# Patient Record
Sex: Female | Born: 1985 | Race: Black or African American | Hispanic: No | Marital: Single | Smoking: Former smoker
Health system: Southern US, Community
[De-identification: ages and names within clinical notes are randomized; demographics above are authoritative.]

## PROBLEM LIST (undated history)

## (undated) ENCOUNTER — Inpatient Hospital Stay (HOSPITAL_COMMUNITY): Payer: Self-pay

## (undated) DIAGNOSIS — O139 Gestational [pregnancy-induced] hypertension without significant proteinuria, unspecified trimester: Secondary | ICD-10-CM

## (undated) DIAGNOSIS — F32A Depression, unspecified: Secondary | ICD-10-CM

## (undated) DIAGNOSIS — D649 Anemia, unspecified: Secondary | ICD-10-CM

## (undated) DIAGNOSIS — E876 Hypokalemia: Secondary | ICD-10-CM

## (undated) DIAGNOSIS — F329 Major depressive disorder, single episode, unspecified: Secondary | ICD-10-CM

## (undated) DIAGNOSIS — N390 Urinary tract infection, site not specified: Secondary | ICD-10-CM

## (undated) DIAGNOSIS — R51 Headache: Secondary | ICD-10-CM

## (undated) DIAGNOSIS — O24419 Gestational diabetes mellitus in pregnancy, unspecified control: Secondary | ICD-10-CM

## (undated) HISTORY — PX: CHOLECYSTECTOMY: SHX55

## (undated) HISTORY — PX: TONSILLECTOMY: SUR1361

---

## 2008-01-01 ENCOUNTER — Emergency Department (HOSPITAL_COMMUNITY): Admission: EM | Admit: 2008-01-01 | Discharge: 2008-01-02 | Payer: Self-pay | Admitting: Emergency Medicine

## 2008-02-12 ENCOUNTER — Inpatient Hospital Stay (HOSPITAL_COMMUNITY): Admission: AD | Admit: 2008-02-12 | Discharge: 2008-02-12 | Payer: Self-pay | Admitting: Obstetrics & Gynecology

## 2008-04-26 ENCOUNTER — Inpatient Hospital Stay (HOSPITAL_COMMUNITY): Admission: AD | Admit: 2008-04-26 | Discharge: 2008-04-26 | Payer: Self-pay | Admitting: Obstetrics & Gynecology

## 2008-07-18 ENCOUNTER — Emergency Department (HOSPITAL_COMMUNITY): Admission: EM | Admit: 2008-07-18 | Discharge: 2008-07-18 | Payer: Self-pay | Admitting: Emergency Medicine

## 2008-07-18 ENCOUNTER — Other Ambulatory Visit: Payer: Self-pay | Admitting: Obstetrics & Gynecology

## 2008-09-22 ENCOUNTER — Emergency Department (HOSPITAL_COMMUNITY): Admission: EM | Admit: 2008-09-22 | Discharge: 2008-09-22 | Payer: Self-pay | Admitting: Emergency Medicine

## 2008-10-17 ENCOUNTER — Emergency Department (HOSPITAL_COMMUNITY): Admission: EM | Admit: 2008-10-17 | Discharge: 2008-10-17 | Payer: Self-pay | Admitting: Emergency Medicine

## 2008-10-19 ENCOUNTER — Emergency Department (HOSPITAL_COMMUNITY): Admission: EM | Admit: 2008-10-19 | Discharge: 2008-10-19 | Payer: Self-pay | Admitting: Emergency Medicine

## 2008-11-05 ENCOUNTER — Inpatient Hospital Stay (HOSPITAL_COMMUNITY): Admission: AD | Admit: 2008-11-05 | Discharge: 2008-11-05 | Payer: Self-pay | Admitting: Obstetrics & Gynecology

## 2009-01-16 ENCOUNTER — Observation Stay (HOSPITAL_COMMUNITY): Admission: EM | Admit: 2009-01-16 | Discharge: 2009-01-17 | Payer: Self-pay | Admitting: Emergency Medicine

## 2009-01-18 ENCOUNTER — Emergency Department (HOSPITAL_COMMUNITY): Admission: EM | Admit: 2009-01-18 | Discharge: 2009-01-18 | Payer: Self-pay | Admitting: Emergency Medicine

## 2009-02-11 ENCOUNTER — Emergency Department (HOSPITAL_COMMUNITY): Admission: EM | Admit: 2009-02-11 | Discharge: 2009-02-11 | Payer: Self-pay | Admitting: Emergency Medicine

## 2009-03-01 ENCOUNTER — Emergency Department (HOSPITAL_COMMUNITY): Admission: EM | Admit: 2009-03-01 | Discharge: 2009-03-01 | Payer: Self-pay | Admitting: Emergency Medicine

## 2009-03-02 ENCOUNTER — Inpatient Hospital Stay (HOSPITAL_COMMUNITY): Admission: AD | Admit: 2009-03-02 | Discharge: 2009-03-02 | Payer: Self-pay | Admitting: Obstetrics & Gynecology

## 2009-03-15 ENCOUNTER — Inpatient Hospital Stay (HOSPITAL_COMMUNITY): Admission: AD | Admit: 2009-03-15 | Discharge: 2009-03-15 | Payer: Self-pay | Admitting: Obstetrics and Gynecology

## 2009-04-05 ENCOUNTER — Emergency Department (HOSPITAL_COMMUNITY): Admission: EM | Admit: 2009-04-05 | Discharge: 2009-04-06 | Payer: Self-pay | Admitting: Emergency Medicine

## 2009-04-06 ENCOUNTER — Emergency Department (HOSPITAL_COMMUNITY): Admission: EM | Admit: 2009-04-06 | Discharge: 2009-04-06 | Payer: Self-pay | Admitting: Family Medicine

## 2009-04-06 ENCOUNTER — Emergency Department (HOSPITAL_COMMUNITY): Admission: EM | Admit: 2009-04-06 | Discharge: 2009-04-06 | Payer: Self-pay | Admitting: Emergency Medicine

## 2009-04-07 ENCOUNTER — Emergency Department (HOSPITAL_COMMUNITY): Admission: EM | Admit: 2009-04-07 | Discharge: 2009-04-07 | Payer: Self-pay | Admitting: Emergency Medicine

## 2009-05-08 ENCOUNTER — Encounter: Payer: Self-pay | Admitting: Family Medicine

## 2009-05-08 ENCOUNTER — Inpatient Hospital Stay (HOSPITAL_COMMUNITY): Admission: RE | Admit: 2009-05-08 | Discharge: 2009-05-08 | Payer: Self-pay | Admitting: Family Medicine

## 2009-05-19 ENCOUNTER — Ambulatory Visit (HOSPITAL_COMMUNITY): Admission: RE | Admit: 2009-05-19 | Discharge: 2009-05-19 | Payer: Self-pay | Admitting: Family Medicine

## 2009-05-29 ENCOUNTER — Ambulatory Visit: Payer: Self-pay | Admitting: Physician Assistant

## 2009-05-29 ENCOUNTER — Inpatient Hospital Stay (HOSPITAL_COMMUNITY): Admission: RE | Admit: 2009-05-29 | Discharge: 2009-05-29 | Payer: Self-pay | Admitting: Obstetrics & Gynecology

## 2009-06-15 ENCOUNTER — Other Ambulatory Visit: Payer: Self-pay | Admitting: Emergency Medicine

## 2009-06-16 ENCOUNTER — Encounter: Payer: Self-pay | Admitting: Family Medicine

## 2009-06-30 ENCOUNTER — Ambulatory Visit: Payer: Self-pay | Admitting: Advanced Practice Midwife

## 2009-06-30 ENCOUNTER — Inpatient Hospital Stay (HOSPITAL_COMMUNITY): Admission: AD | Admit: 2009-06-30 | Discharge: 2009-06-30 | Payer: Self-pay | Admitting: Obstetrics & Gynecology

## 2009-07-06 ENCOUNTER — Inpatient Hospital Stay (HOSPITAL_COMMUNITY): Admission: AD | Admit: 2009-07-06 | Discharge: 2009-07-06 | Payer: Self-pay | Admitting: Obstetrics and Gynecology

## 2009-07-23 ENCOUNTER — Ambulatory Visit: Payer: Self-pay | Admitting: Advanced Practice Midwife

## 2009-07-27 ENCOUNTER — Ambulatory Visit: Payer: Self-pay | Admitting: Advanced Practice Midwife

## 2009-07-27 ENCOUNTER — Inpatient Hospital Stay (HOSPITAL_COMMUNITY): Admission: AD | Admit: 2009-07-27 | Discharge: 2009-07-27 | Payer: Self-pay | Admitting: Obstetrics & Gynecology

## 2009-08-18 ENCOUNTER — Inpatient Hospital Stay (HOSPITAL_COMMUNITY): Admission: AD | Admit: 2009-08-18 | Discharge: 2009-08-18 | Payer: Self-pay | Admitting: Obstetrics & Gynecology

## 2009-09-04 ENCOUNTER — Observation Stay (HOSPITAL_COMMUNITY): Admission: AD | Admit: 2009-09-04 | Discharge: 2009-09-04 | Payer: Self-pay | Admitting: Obstetrics & Gynecology

## 2009-09-13 ENCOUNTER — Ambulatory Visit: Payer: Self-pay | Admitting: Obstetrics and Gynecology

## 2009-09-13 ENCOUNTER — Inpatient Hospital Stay (HOSPITAL_COMMUNITY): Admission: AD | Admit: 2009-09-13 | Discharge: 2009-09-13 | Payer: Self-pay | Admitting: Obstetrics & Gynecology

## 2009-09-20 ENCOUNTER — Inpatient Hospital Stay (HOSPITAL_COMMUNITY): Admission: RE | Admit: 2009-09-20 | Discharge: 2009-09-23 | Payer: Self-pay | Admitting: Obstetrics & Gynecology

## 2009-09-20 ENCOUNTER — Ambulatory Visit: Payer: Self-pay | Admitting: Obstetrics & Gynecology

## 2009-10-04 ENCOUNTER — Ambulatory Visit: Payer: Self-pay | Admitting: Family Medicine

## 2009-10-04 ENCOUNTER — Inpatient Hospital Stay (HOSPITAL_COMMUNITY): Admission: AD | Admit: 2009-10-04 | Discharge: 2009-10-04 | Payer: Self-pay | Admitting: Obstetrics & Gynecology

## 2010-02-10 ENCOUNTER — Emergency Department (HOSPITAL_COMMUNITY): Admission: EM | Admit: 2010-02-10 | Discharge: 2010-02-11 | Payer: Self-pay | Admitting: Emergency Medicine

## 2010-02-21 ENCOUNTER — Inpatient Hospital Stay (HOSPITAL_COMMUNITY)
Admission: AD | Admit: 2010-02-21 | Discharge: 2010-02-21 | Payer: Self-pay | Source: Home / Self Care | Admitting: Obstetrics and Gynecology

## 2010-02-22 ENCOUNTER — Inpatient Hospital Stay (HOSPITAL_COMMUNITY): Admission: AD | Admit: 2010-02-22 | Discharge: 2009-07-23 | Payer: Self-pay | Admitting: Obstetrics & Gynecology

## 2010-02-22 ENCOUNTER — Inpatient Hospital Stay (HOSPITAL_COMMUNITY): Admission: AD | Admit: 2010-02-22 | Discharge: 2009-06-16 | Payer: Self-pay | Admitting: Family Medicine

## 2010-02-22 ENCOUNTER — Emergency Department (HOSPITAL_COMMUNITY): Admission: EM | Admit: 2010-02-22 | Discharge: 2009-11-28 | Payer: Self-pay | Admitting: Emergency Medicine

## 2010-02-27 ENCOUNTER — Emergency Department (HOSPITAL_COMMUNITY)
Admission: EM | Admit: 2010-02-27 | Discharge: 2010-02-27 | Payer: Self-pay | Source: Home / Self Care | Admitting: Emergency Medicine

## 2010-04-04 ENCOUNTER — Inpatient Hospital Stay (HOSPITAL_COMMUNITY)
Admission: AD | Admit: 2010-04-04 | Discharge: 2010-04-04 | Payer: Self-pay | Source: Home / Self Care | Attending: Obstetrics and Gynecology | Admitting: Obstetrics and Gynecology

## 2010-04-09 LAB — HCG, QUANTITATIVE, PREGNANCY: hCG, Beta Chain, Quant, S: 62370 m[IU]/mL — ABNORMAL HIGH (ref <5)

## 2010-04-09 LAB — CBC
MCH: 24.7 pg — ABNORMAL LOW (ref 26.0–34.0)
MCV: 75.6 fL — ABNORMAL LOW (ref 78.0–100.0)
RDW: 16.6 % — ABNORMAL HIGH (ref 11.5–15.5)

## 2010-04-09 LAB — URINALYSIS, ROUTINE W REFLEX MICROSCOPIC
Bilirubin Urine: NEGATIVE
Hgb urine dipstick: NEGATIVE
Ketones, ur: NEGATIVE mg/dL
Nitrite: NEGATIVE
Protein, ur: NEGATIVE mg/dL
Urine Glucose, Fasting: NEGATIVE mg/dL
pH: 7 (ref 5.0–8.0)

## 2010-04-09 LAB — GC/CHLAMYDIA PROBE AMP, GENITAL
Chlamydia, DNA Probe: NEGATIVE
GC Probe Amp, Genital: NEGATIVE

## 2010-04-09 LAB — WET PREP, GENITAL
Trich, Wet Prep: NONE SEEN
Yeast Wet Prep HPF POC: NONE SEEN

## 2010-05-16 ENCOUNTER — Emergency Department (HOSPITAL_COMMUNITY)
Admission: EM | Admit: 2010-05-16 | Discharge: 2010-05-16 | Disposition: A | Discharge status: Home / Self Care | Payer: Medicaid Other | Attending: Emergency Medicine | Admitting: Emergency Medicine

## 2010-05-16 ENCOUNTER — Emergency Department (HOSPITAL_COMMUNITY): Payer: Medicaid Other

## 2010-05-16 DIAGNOSIS — O2390 Unspecified genitourinary tract infection in pregnancy, unspecified trimester: Secondary | ICD-10-CM | POA: Insufficient documentation

## 2010-05-16 DIAGNOSIS — K219 Gastro-esophageal reflux disease without esophagitis: Secondary | ICD-10-CM | POA: Insufficient documentation

## 2010-05-16 DIAGNOSIS — R079 Chest pain, unspecified: Secondary | ICD-10-CM | POA: Insufficient documentation

## 2010-05-16 DIAGNOSIS — R071 Chest pain on breathing: Secondary | ICD-10-CM | POA: Insufficient documentation

## 2010-05-16 DIAGNOSIS — F3289 Other specified depressive episodes: Secondary | ICD-10-CM | POA: Insufficient documentation

## 2010-05-16 DIAGNOSIS — N390 Urinary tract infection, site not specified: Secondary | ICD-10-CM | POA: Insufficient documentation

## 2010-05-16 DIAGNOSIS — F329 Major depressive disorder, single episode, unspecified: Secondary | ICD-10-CM | POA: Insufficient documentation

## 2010-05-16 DIAGNOSIS — I10 Essential (primary) hypertension: Secondary | ICD-10-CM | POA: Insufficient documentation

## 2010-05-16 LAB — URINALYSIS, ROUTINE W REFLEX MICROSCOPIC
Bilirubin Urine: NEGATIVE
Hgb urine dipstick: NEGATIVE
Ketones, ur: NEGATIVE mg/dL
Nitrite: NEGATIVE
Protein, ur: NEGATIVE mg/dL
Urobilinogen, UA: 1 mg/dL (ref 0.0–1.0)

## 2010-05-16 LAB — POCT PREGNANCY, URINE: Preg Test, Ur: POSITIVE

## 2010-05-16 LAB — URINE MICROSCOPIC-ADD ON

## 2010-05-29 LAB — COMPREHENSIVE METABOLIC PANEL
Albumin: 3.7 g/dL (ref 3.5–5.2)
Alkaline Phosphatase: 114 U/L (ref 39–117)
BUN: 3 mg/dL — ABNORMAL LOW (ref 6–23)
CO2: 25 mEq/L (ref 19–32)
Chloride: 107 mEq/L (ref 96–112)
Creatinine, Ser: 0.76 mg/dL (ref 0.4–1.2)
GFR calc non Af Amer: >60 mL/min (ref >60)
Glucose, Bld: 97 mg/dL (ref 70–99)
Potassium: 3.5 mEq/L (ref 3.5–5.1)
Total Bilirubin: 0.4 mg/dL (ref 0.3–1.2)

## 2010-05-29 LAB — DIFFERENTIAL
Basophils Absolute: 0 10*3/uL (ref 0.0–0.1)
Basophils Relative: 0 % (ref 0–1)
Lymphocytes Relative: 42 % (ref 12–46)
Monocytes Absolute: 0.5 10*3/uL (ref 0.1–1.0)
Neutro Abs: 4.3 10*3/uL (ref 1.7–7.7)
Neutrophils Relative %: 47 % (ref 43–77)

## 2010-05-29 LAB — URINE MICROSCOPIC-ADD ON

## 2010-05-29 LAB — URINALYSIS, ROUTINE W REFLEX MICROSCOPIC
Bilirubin Urine: NEGATIVE
Bilirubin Urine: NEGATIVE
Glucose, UA: NEGATIVE mg/dL
Ketones, ur: NEGATIVE mg/dL
Ketones, ur: NEGATIVE mg/dL
Nitrite: NEGATIVE
Nitrite: NEGATIVE
Nitrite: NEGATIVE
Specific Gravity, Urine: 1.012 (ref 1.005–1.030)
Specific Gravity, Urine: 1.025 (ref 1.005–1.030)
Urobilinogen, UA: 0.2 mg/dL (ref 0.0–1.0)
Urobilinogen, UA: 1 mg/dL (ref 0.0–1.0)
pH: 6 (ref 5.0–8.0)
pH: 6.5 (ref 5.0–8.0)

## 2010-05-29 LAB — CBC
HCT: 34.3 % — ABNORMAL LOW (ref 36.0–46.0)
Hemoglobin: 10.9 g/dL — ABNORMAL LOW (ref 12.0–15.0)
MCH: 23.3 pg — ABNORMAL LOW (ref 26.0–34.0)
MCV: 73.4 fL — ABNORMAL LOW (ref 78.0–100.0)
RBC: 4.67 MIL/uL (ref 3.87–5.11)
WBC: 9.2 10*3/uL (ref 4.0–10.5)

## 2010-05-29 LAB — URINE CULTURE
Culture  Setup Time: 201111271259
Culture  Setup Time: 201112131737

## 2010-06-03 LAB — BASIC METABOLIC PANEL
BUN: 2 mg/dL — ABNORMAL LOW (ref 6–23)
Chloride: 105 mEq/L (ref 96–112)
Creatinine, Ser: 0.56 mg/dL (ref 0.4–1.2)

## 2010-06-03 LAB — CROSSMATCH: ABO/RH(D): O POS

## 2010-06-03 LAB — URINALYSIS, ROUTINE W REFLEX MICROSCOPIC
Ketones, ur: NEGATIVE mg/dL
Nitrite: NEGATIVE
Specific Gravity, Urine: 1.016 (ref 1.005–1.030)
pH: 7 (ref 5.0–8.0)

## 2010-06-03 LAB — CBC
HCT: 26.4 % — ABNORMAL LOW (ref 36.0–46.0)
HCT: 31.5 % — ABNORMAL LOW (ref 36.0–46.0)
Hemoglobin: 8.8 g/dL — ABNORMAL LOW (ref 12.0–15.0)
Hemoglobin: 10.5 g/dL — ABNORMAL LOW (ref 12.0–15.0)
MCH: 28.1 pg (ref 26.0–34.0)
MCH: 28.8 pg (ref 26.0–34.0)
MCH: 28.5 pg (ref 26.0–34.0)
MCHC: 33.4 g/dL (ref 30.0–36.0)
MCV: 84.2 fL (ref 78.0–100.0)
MCV: 84.3 fL (ref 78.0–100.0)
MCV: 89.1 fL (ref 78.0–100.0)
MCV: 85.3 fL (ref 78.0–100.0)
Platelets: 180 10*3/uL (ref 150–400)
Platelets: 211 10*3/uL (ref 150–400)
Platelets: 212 10*3/uL (ref 150–400)
RBC: 2.75 MIL/uL — ABNORMAL LOW (ref 3.87–5.11)
RBC: 3.69 MIL/uL — ABNORMAL LOW (ref 3.87–5.11)
RDW: 14.4 % (ref 11.5–15.5)
WBC: 8.7 10*3/uL (ref 4.0–10.5)

## 2010-06-03 LAB — RAPID URINE DRUG SCREEN, HOSP PERFORMED
Barbiturates: NOT DETECTED
Benzodiazepines: NOT DETECTED

## 2010-06-03 LAB — GLUCOSE, CAPILLARY: Glucose-Capillary: 80 mg/dL (ref 70–99)

## 2010-06-03 LAB — POCT CARDIAC MARKERS
CKMB, poc: <1 ng/mL — ABNORMAL LOW (ref 1.0–8.0)
Troponin i, poc: <0.05 ng/mL (ref 0.00–0.09)

## 2010-06-03 LAB — DIFFERENTIAL
Lymphocytes Relative: 28 % (ref 12–46)
Lymphs Abs: 2.4 10*3/uL (ref 0.7–4.0)
Neutrophils Relative %: 64 % (ref 43–77)

## 2010-06-03 LAB — HCG, QUANTITATIVE, PREGNANCY: hCG, Beta Chain, Quant, S: 36245 m[IU]/mL — ABNORMAL HIGH (ref <5)

## 2010-06-03 LAB — URINE MICROSCOPIC-ADD ON

## 2010-06-03 LAB — DIC (DISSEMINATED INTRAVASCULAR COAGULATION)PANEL
INR: 1.08 (ref 0.00–1.49)
Prothrombin Time: 13.9 seconds (ref 11.6–15.2)

## 2010-06-03 LAB — POCT I-STAT, CHEM 8
Calcium, Ion: 1.2 mmol/L (ref 1.12–1.32)
Chloride: 106 mEq/L (ref 96–112)
HCT: 32 % — ABNORMAL LOW (ref 36.0–46.0)
Hemoglobin: 10.9 g/dL — ABNORMAL LOW (ref 12.0–15.0)

## 2010-06-03 LAB — RAPID STREP SCREEN (MED CTR MEBANE ONLY): Streptococcus, Group A Screen (Direct): NEGATIVE

## 2010-06-04 LAB — URINALYSIS, ROUTINE W REFLEX MICROSCOPIC
Glucose, UA: NEGATIVE mg/dL
Nitrite: NEGATIVE
Specific Gravity, Urine: 1.025 (ref 1.005–1.030)
pH: 6.5 (ref 5.0–8.0)

## 2010-06-04 LAB — GC/CHLAMYDIA PROBE AMP, GENITAL
Chlamydia, DNA Probe: NEGATIVE
GC Probe Amp, Genital: NEGATIVE

## 2010-06-04 LAB — URINE CULTURE: Colony Count: 1000

## 2010-06-04 LAB — URINE MICROSCOPIC-ADD ON

## 2010-06-05 LAB — URINALYSIS, ROUTINE W REFLEX MICROSCOPIC
Bilirubin Urine: NEGATIVE
Glucose, UA: NEGATIVE mg/dL
Glucose, UA: NEGATIVE mg/dL
Glucose, UA: 100 mg/dL — AB
Hgb urine dipstick: NEGATIVE
Hgb urine dipstick: NEGATIVE
Ketones, ur: NEGATIVE mg/dL
Ketones, ur: NEGATIVE mg/dL
Ketones, ur: NEGATIVE mg/dL
Nitrite: NEGATIVE
Nitrite: NEGATIVE
Nitrite: NEGATIVE
Protein, ur: NEGATIVE mg/dL
Protein, ur: NEGATIVE mg/dL
Protein, ur: 30 mg/dL — AB
Protein, ur: NEGATIVE mg/dL
Specific Gravity, Urine: >1.03 — ABNORMAL HIGH (ref 1.005–1.030)
Urobilinogen, UA: 1 mg/dL (ref 0.0–1.0)
Urobilinogen, UA: >8 mg/dL — ABNORMAL HIGH (ref 0.0–1.0)
pH: 5.5 (ref 5.0–8.0)
pH: 7.5 (ref 5.0–8.0)
pH: 6 (ref 5.0–8.0)

## 2010-06-05 LAB — DIFFERENTIAL
Basophils Absolute: 0 K/uL (ref 0.0–0.1)
Basophils Relative: 0 % (ref 0–1)
Eosinophils Absolute: 0.2 K/uL (ref 0.0–0.7)
Eosinophils Relative: 2 % (ref 0–5)
Lymphocytes Relative: 14 % (ref 12–46)
Lymphs Abs: 1 K/uL (ref 0.7–4.0)
Monocytes Absolute: 0.4 K/uL (ref 0.1–1.0)
Monocytes Relative: 6 % (ref 3–12)
Neutro Abs: 5.8 K/uL (ref 1.7–7.7)
Neutrophils Relative %: 78 % — ABNORMAL HIGH (ref 43–77)

## 2010-06-05 LAB — RAPID URINE DRUG SCREEN, HOSP PERFORMED
Amphetamines: NOT DETECTED
Barbiturates: POSITIVE — AB
Benzodiazepines: NOT DETECTED
Cocaine: NOT DETECTED
Opiates: NOT DETECTED
Tetrahydrocannabinol: POSITIVE — AB

## 2010-06-05 LAB — URINE CULTURE
Colony Count: 10000
Colony Count: 15000

## 2010-06-05 LAB — URINE MICROSCOPIC-ADD ON

## 2010-06-05 LAB — WET PREP, GENITAL
Trich, Wet Prep: NONE SEEN
Trich, Wet Prep: NONE SEEN
Yeast Wet Prep HPF POC: NONE SEEN

## 2010-06-05 LAB — COMPREHENSIVE METABOLIC PANEL
ALT: 13 U/L (ref 0–35)
AST: 17 U/L (ref 0–37)
Albumin: 2.7 g/dL — ABNORMAL LOW (ref 3.5–5.2)
Alkaline Phosphatase: 118 U/L — ABNORMAL HIGH (ref 39–117)
BUN: 2 mg/dL — ABNORMAL LOW (ref 6–23)
Chloride: 104 mEq/L (ref 96–112)
Potassium: 3.4 mEq/L — ABNORMAL LOW (ref 3.5–5.1)
Sodium: 136 mEq/L (ref 135–145)
Total Bilirubin: 0.5 mg/dL (ref 0.3–1.2)
Total Protein: 6.5 g/dL (ref 6.0–8.3)

## 2010-06-05 LAB — CBC
HCT: 32.9 % — ABNORMAL LOW (ref 36.0–46.0)
HCT: 32.7 % — ABNORMAL LOW (ref 36.0–46.0)
Hemoglobin: 11.1 g/dL — ABNORMAL LOW (ref 12.0–15.0)
Platelets: 216 10*3/uL (ref 150–400)
RDW: 13.8 % (ref 11.5–15.5)
RDW: 13.1 % (ref 11.5–15.5)
WBC: 8.9 10*3/uL (ref 4.0–10.5)

## 2010-06-05 LAB — GC/CHLAMYDIA PROBE AMP, GENITAL: Chlamydia, DNA Probe: NEGATIVE

## 2010-06-05 LAB — GC/CHLAMYDIA PROBE AMP, URINE
Chlamydia, Swab/Urine, PCR: NEGATIVE
GC Probe Amp, Urine: NEGATIVE

## 2010-06-06 LAB — URINALYSIS, ROUTINE W REFLEX MICROSCOPIC
Bilirubin Urine: NEGATIVE
Ketones, ur: NEGATIVE mg/dL
Nitrite: NEGATIVE
Urobilinogen, UA: 0.2 mg/dL (ref 0.0–1.0)

## 2010-06-06 LAB — KLEIHAUER-BETKE STAIN
Fetal Cells %: 0 %
Quantitation Fetal Hemoglobin: 0 mL

## 2010-06-06 LAB — URINE CULTURE: Colony Count: 8000

## 2010-06-11 LAB — URINALYSIS, ROUTINE W REFLEX MICROSCOPIC
Ketones, ur: NEGATIVE mg/dL
Nitrite: NEGATIVE
Specific Gravity, Urine: 1.02 (ref 1.005–1.030)
Urobilinogen, UA: 0.2 mg/dL (ref 0.0–1.0)
pH: 7 (ref 5.0–8.0)

## 2010-06-11 LAB — CBC
MCHC: 34.2 g/dL (ref 30.0–36.0)
Platelets: 218 10*3/uL (ref 150–400)
RBC: 3.59 MIL/uL — ABNORMAL LOW (ref 3.87–5.11)
WBC: 10.8 10*3/uL — ABNORMAL HIGH (ref 4.0–10.5)

## 2010-06-11 LAB — ABO/RH: ABO/RH(D): O POS

## 2010-06-11 LAB — WET PREP, GENITAL

## 2010-06-18 LAB — URINALYSIS, ROUTINE W REFLEX MICROSCOPIC
Bilirubin Urine: NEGATIVE
Glucose, UA: NEGATIVE mg/dL
Hgb urine dipstick: NEGATIVE
Ketones, ur: NEGATIVE mg/dL
Protein, ur: NEGATIVE mg/dL
Urobilinogen, UA: 1 mg/dL (ref 0.0–1.0)

## 2010-06-18 LAB — WET PREP, GENITAL
Clue Cells Wet Prep HPF POC: NONE SEEN
Yeast Wet Prep HPF POC: NONE SEEN

## 2010-06-18 LAB — URINE CULTURE
Colony Count: NO GROWTH
Culture: NO GROWTH

## 2010-06-18 LAB — ABO/RH: ABO/RH(D): O POS

## 2010-06-18 LAB — URINE MICROSCOPIC-ADD ON

## 2010-06-19 LAB — URINALYSIS, ROUTINE W REFLEX MICROSCOPIC
Protein, ur: NEGATIVE mg/dL
Urobilinogen, UA: 1 mg/dL (ref 0.0–1.0)

## 2010-06-19 LAB — POCT PREGNANCY, URINE: Preg Test, Ur: POSITIVE

## 2010-06-19 LAB — COMPREHENSIVE METABOLIC PANEL
ALT: 11 U/L (ref 0–35)
Albumin: 3.5 g/dL (ref 3.5–5.2)
Alkaline Phosphatase: 73 U/L (ref 39–117)
BUN: 5 mg/dL — ABNORMAL LOW (ref 6–23)
GFR calc Af Amer: >60 mL/min (ref >60)
Potassium: 3.6 mEq/L (ref 3.5–5.1)
Sodium: 134 mEq/L — ABNORMAL LOW (ref 135–145)
Total Protein: 6.9 g/dL (ref 6.0–8.3)

## 2010-06-19 LAB — CBC
Platelets: 251 10*3/uL (ref 150–400)
RDW: 13.4 % (ref 11.5–15.5)

## 2010-06-19 LAB — DIFFERENTIAL
Basophils Relative: 1 % (ref 0–1)
Monocytes Absolute: 0.8 10*3/uL (ref 0.1–1.0)
Monocytes Relative: 8 % (ref 3–12)
Neutro Abs: 6.3 10*3/uL (ref 1.7–7.7)

## 2010-06-20 LAB — URINE MICROSCOPIC-ADD ON

## 2010-06-20 LAB — URINALYSIS, ROUTINE W REFLEX MICROSCOPIC
Glucose, UA: NEGATIVE mg/dL
Hgb urine dipstick: NEGATIVE
Protein, ur: NEGATIVE mg/dL

## 2010-06-20 LAB — CBC
Hemoglobin: 13.1 g/dL (ref 12.0–15.0)
Hemoglobin: 14.1 g/dL (ref 12.0–15.0)
MCHC: 34.4 g/dL (ref 30.0–36.0)
MCV: 87.2 fL (ref 78.0–100.0)
Platelets: 237 10*3/uL (ref 150–400)
RDW: 13.2 % (ref 11.5–15.5)
RDW: 13.3 % (ref 11.5–15.5)

## 2010-06-20 LAB — BASIC METABOLIC PANEL
CO2: 26 mEq/L (ref 19–32)
Calcium: 9.3 mg/dL (ref 8.4–10.5)
Chloride: 104 mEq/L (ref 96–112)
Creatinine, Ser: 0.84 mg/dL (ref 0.4–1.2)
Glucose, Bld: 88 mg/dL (ref 70–99)

## 2010-06-20 LAB — COMPREHENSIVE METABOLIC PANEL
ALT: 15 U/L (ref 0–35)
AST: 17 U/L (ref 0–37)
Albumin: 3.7 g/dL (ref 3.5–5.2)
Alkaline Phosphatase: 90 U/L (ref 39–117)
GFR calc Af Amer: >60 mL/min (ref >60)
Potassium: 3.4 mEq/L — ABNORMAL LOW (ref 3.5–5.1)
Sodium: 140 mEq/L (ref 135–145)
Total Protein: 7.2 g/dL (ref 6.0–8.3)

## 2010-06-20 LAB — DIFFERENTIAL
Basophils Absolute: 0.1 10*3/uL (ref 0.0–0.1)
Basophils Relative: 1 % (ref 0–1)
Basophils Relative: 1 % (ref 0–1)
Eosinophils Absolute: 0.3 10*3/uL (ref 0.0–0.7)
Eosinophils Absolute: 0.4 10*3/uL (ref 0.0–0.7)
Monocytes Absolute: 0.5 10*3/uL (ref 0.1–1.0)
Monocytes Absolute: 0.6 10*3/uL (ref 0.1–1.0)
Monocytes Relative: 7 % (ref 3–12)
Neutro Abs: 5 10*3/uL (ref 1.7–7.7)

## 2010-06-20 LAB — POCT PREGNANCY, URINE
Preg Test, Ur: NEGATIVE
Preg Test, Ur: NEGATIVE

## 2010-06-20 LAB — POCT CARDIAC MARKERS: Myoglobin, poc: 51 ng/mL (ref 12–200)

## 2010-06-23 LAB — URINALYSIS, ROUTINE W REFLEX MICROSCOPIC
Bilirubin Urine: NEGATIVE
Glucose, UA: NEGATIVE mg/dL
Glucose, UA: NEGATIVE mg/dL
Hgb urine dipstick: NEGATIVE
Ketones, ur: NEGATIVE mg/dL
Nitrite: NEGATIVE
Protein, ur: NEGATIVE mg/dL
Protein, ur: NEGATIVE mg/dL
Specific Gravity, Urine: 1.027 (ref 1.005–1.030)
Urobilinogen, UA: 1 mg/dL (ref 0.0–1.0)
pH: 6 (ref 5.0–8.0)
pH: 6 (ref 5.0–8.0)
pH: 7 (ref 5.0–8.0)

## 2010-06-23 LAB — DIFFERENTIAL
Basophils Absolute: 0 10*3/uL (ref 0.0–0.1)
Basophils Relative: 1 % (ref 0–1)
Eosinophils Relative: 5 % (ref 0–5)
Lymphocytes Relative: 26 % (ref 12–46)
Lymphocytes Relative: 28 % (ref 12–46)
Monocytes Absolute: 0.4 10*3/uL (ref 0.1–1.0)
Monocytes Absolute: 0.6 10*3/uL (ref 0.1–1.0)
Monocytes Relative: 9 % (ref 3–12)
Neutro Abs: 4.1 10*3/uL (ref 1.7–7.7)
Neutro Abs: 4.3 10*3/uL (ref 1.7–7.7)
Neutrophils Relative %: 62 % (ref 43–77)

## 2010-06-23 LAB — CBC
HCT: 38.2 % (ref 36.0–46.0)
HCT: 39.3 % (ref 36.0–46.0)
Hemoglobin: 12.9 g/dL (ref 12.0–15.0)
MCV: 85.8 fL (ref 78.0–100.0)
Platelets: 260 10*3/uL (ref 150–400)
Platelets: 286 10*3/uL (ref 150–400)
RBC: 4.58 MIL/uL (ref 3.87–5.11)
RDW: 14.3 % (ref 11.5–15.5)
WBC: 6.8 10*3/uL (ref 4.0–10.5)
WBC: 6.9 10*3/uL (ref 4.0–10.5)

## 2010-06-23 LAB — POCT PREGNANCY, URINE
Preg Test, Ur: NEGATIVE
Preg Test, Ur: NEGATIVE

## 2010-06-23 LAB — COMPREHENSIVE METABOLIC PANEL
AST: 18 U/L (ref 0–37)
Albumin: 3.8 g/dL (ref 3.5–5.2)
Alkaline Phosphatase: 82 U/L (ref 39–117)
Alkaline Phosphatase: 84 U/L (ref 39–117)
BUN: 6 mg/dL (ref 6–23)
BUN: 6 mg/dL (ref 6–23)
CO2: 29 mEq/L (ref 19–32)
Chloride: 103 mEq/L (ref 96–112)
Chloride: 105 mEq/L (ref 96–112)
Creatinine, Ser: 0.78 mg/dL (ref 0.4–1.2)
Creatinine, Ser: 0.79 mg/dL (ref 0.4–1.2)
GFR calc Af Amer: >60 mL/min (ref >60)
GFR calc non Af Amer: >60 mL/min (ref >60)
Glucose, Bld: 87 mg/dL (ref 70–99)
Potassium: 3.6 mEq/L (ref 3.5–5.1)
Potassium: 3.9 mEq/L (ref 3.5–5.1)
Total Bilirubin: 0.3 mg/dL (ref 0.3–1.2)
Total Protein: 7.2 g/dL (ref 6.0–8.3)

## 2010-06-23 LAB — LIPASE, BLOOD
Lipase: 17 U/L (ref 11–59)
Lipase: 17 U/L (ref 11–59)

## 2010-06-23 LAB — D-DIMER, QUANTITATIVE: D-Dimer, Quant: 0.43 ug/mL-FEU (ref 0.00–0.48)

## 2010-06-23 LAB — WET PREP, GENITAL
Trich, Wet Prep: NONE SEEN
Yeast Wet Prep HPF POC: NONE SEEN

## 2010-06-23 LAB — LACTIC ACID, PLASMA: Lactic Acid, Venous: 0.6 mmol/L (ref 0.5–2.2)

## 2010-06-23 LAB — URINE CULTURE: Colony Count: >=100000

## 2010-06-23 LAB — URINE MICROSCOPIC-ADD ON

## 2010-06-24 LAB — DIFFERENTIAL
Eosinophils Absolute: 0.3 10*3/uL (ref 0.0–0.7)
Lymphs Abs: 2.2 10*3/uL (ref 0.7–4.0)
Monocytes Relative: 9 % (ref 3–12)
Neutro Abs: 3.2 10*3/uL (ref 1.7–7.7)
Neutrophils Relative %: 51 % (ref 43–77)

## 2010-06-24 LAB — COMPREHENSIVE METABOLIC PANEL
ALT: 11 U/L (ref 0–35)
Calcium: 8.9 mg/dL (ref 8.4–10.5)
Glucose, Bld: 85 mg/dL (ref 70–99)
Sodium: 137 mEq/L (ref 135–145)
Total Protein: 7 g/dL (ref 6.0–8.3)

## 2010-06-24 LAB — CBC
Hemoglobin: 13.1 g/dL (ref 12.0–15.0)
MCHC: 33.8 g/dL (ref 30.0–36.0)
Platelets: 291 10*3/uL (ref 150–400)
RDW: 14.1 % (ref 11.5–15.5)

## 2010-06-24 LAB — URINALYSIS, ROUTINE W REFLEX MICROSCOPIC
Glucose, UA: NEGATIVE mg/dL
Ketones, ur: NEGATIVE mg/dL
Nitrite: NEGATIVE
Protein, ur: NEGATIVE mg/dL
Urobilinogen, UA: 1 mg/dL (ref 0.0–1.0)

## 2010-06-26 LAB — URINALYSIS, ROUTINE W REFLEX MICROSCOPIC
Bilirubin Urine: NEGATIVE
Glucose, UA: NEGATIVE mg/dL
Ketones, ur: NEGATIVE mg/dL
pH: 7 (ref 5.0–8.0)

## 2010-06-26 LAB — GC/CHLAMYDIA PROBE AMP, GENITAL
Chlamydia, DNA Probe: NEGATIVE
GC Probe Amp, Genital: NEGATIVE

## 2010-06-26 LAB — WET PREP, GENITAL: Trich, Wet Prep: NONE SEEN

## 2010-06-26 LAB — COMPREHENSIVE METABOLIC PANEL
ALT: 16 U/L (ref 0–35)
AST: 20 U/L (ref 0–37)
Calcium: 9.4 mg/dL (ref 8.4–10.5)
GFR calc Af Amer: >60 mL/min (ref >60)
Sodium: 137 mEq/L (ref 135–145)
Total Protein: 7.2 g/dL (ref 6.0–8.3)

## 2010-06-26 LAB — URINE MICROSCOPIC-ADD ON

## 2010-06-26 LAB — CBC
MCHC: 33.8 g/dL (ref 30.0–36.0)
RDW: 14.3 % (ref 11.5–15.5)

## 2010-06-28 LAB — RPR: RPR: NONREACTIVE

## 2010-06-28 LAB — HEPATITIS B SURFACE ANTIGEN: Hepatitis B Surface Ag: NEGATIVE

## 2010-06-28 LAB — RUBELLA ANTIBODY, IGM: Rubella: IMMUNE

## 2010-06-30 ENCOUNTER — Inpatient Hospital Stay (HOSPITAL_COMMUNITY)
Admission: AD | Admit: 2010-06-30 | Discharge: 2010-06-30 | Disposition: A | Discharge status: Home / Self Care | Payer: Self-pay | Source: Ambulatory Visit | Attending: Obstetrics and Gynecology | Admitting: Obstetrics and Gynecology

## 2010-06-30 DIAGNOSIS — O2390 Unspecified genitourinary tract infection in pregnancy, unspecified trimester: Secondary | ICD-10-CM

## 2010-06-30 DIAGNOSIS — B373 Candidiasis of vulva and vagina: Secondary | ICD-10-CM

## 2010-06-30 DIAGNOSIS — B3731 Acute candidiasis of vulva and vagina: Secondary | ICD-10-CM | POA: Insufficient documentation

## 2010-06-30 DIAGNOSIS — R109 Unspecified abdominal pain: Secondary | ICD-10-CM

## 2010-06-30 LAB — URINALYSIS, ROUTINE W REFLEX MICROSCOPIC
Ketones, ur: 15 mg/dL — AB
Nitrite: NEGATIVE
Urobilinogen, UA: 1 mg/dL (ref 0.0–1.0)
pH: 7.5 (ref 5.0–8.0)

## 2010-06-30 LAB — URINE MICROSCOPIC-ADD ON

## 2010-06-30 LAB — WET PREP, GENITAL: Trich, Wet Prep: NONE SEEN

## 2010-06-30 LAB — GLUCOSE, CAPILLARY: Glucose-Capillary: 88 mg/dL (ref 70–99)

## 2010-07-02 LAB — GC/CHLAMYDIA PROBE AMP, GENITAL: GC Probe Amp, Genital: NEGATIVE

## 2010-07-03 LAB — URINALYSIS, ROUTINE W REFLEX MICROSCOPIC
Bilirubin Urine: NEGATIVE
Glucose, UA: NEGATIVE mg/dL
Protein, ur: NEGATIVE mg/dL
Specific Gravity, Urine: >1.03 — ABNORMAL HIGH (ref 1.005–1.030)
Urobilinogen, UA: 0.2 mg/dL (ref 0.0–1.0)

## 2010-07-03 LAB — GC/CHLAMYDIA PROBE AMP, GENITAL
Chlamydia, DNA Probe: NEGATIVE
GC Probe Amp, Genital: NEGATIVE

## 2010-07-03 LAB — URINE MICROSCOPIC-ADD ON

## 2010-07-03 LAB — WET PREP, GENITAL: Clue Cells Wet Prep HPF POC: NONE SEEN

## 2010-07-03 LAB — POCT PREGNANCY, URINE: Preg Test, Ur: NEGATIVE

## 2010-07-05 ENCOUNTER — Other Ambulatory Visit: Payer: Self-pay | Admitting: Obstetrics & Gynecology

## 2010-07-05 ENCOUNTER — Other Ambulatory Visit: Payer: Self-pay | Admitting: Family Medicine

## 2010-07-05 DIAGNOSIS — O169 Unspecified maternal hypertension, unspecified trimester: Secondary | ICD-10-CM

## 2010-07-05 DIAGNOSIS — Z3689 Encounter for other specified antenatal screening: Secondary | ICD-10-CM

## 2010-07-05 DIAGNOSIS — O34219 Maternal care for unspecified type scar from previous cesarean delivery: Secondary | ICD-10-CM

## 2010-07-05 DIAGNOSIS — Z331 Pregnant state, incidental: Secondary | ICD-10-CM

## 2010-07-05 DIAGNOSIS — O0930 Supervision of pregnancy with insufficient antenatal care, unspecified trimester: Secondary | ICD-10-CM

## 2010-07-05 LAB — POCT URINALYSIS DIP (DEVICE)
Hgb urine dipstick: NEGATIVE
Ketones, ur: NEGATIVE mg/dL
Protein, ur: 30 mg/dL — AB
pH: 6.5 (ref 5.0–8.0)

## 2010-07-10 ENCOUNTER — Other Ambulatory Visit: Payer: Self-pay | Admitting: Obstetrics & Gynecology

## 2010-07-10 ENCOUNTER — Ambulatory Visit (HOSPITAL_COMMUNITY)
Admission: RE | Admit: 2010-07-10 | Discharge: 2010-07-10 | Disposition: A | Discharge status: Home / Self Care | Payer: Medicaid Other | Source: Ambulatory Visit | Attending: Obstetrics & Gynecology | Admitting: Obstetrics & Gynecology

## 2010-07-10 DIAGNOSIS — O10019 Pre-existing essential hypertension complicating pregnancy, unspecified trimester: Secondary | ICD-10-CM | POA: Insufficient documentation

## 2010-07-10 DIAGNOSIS — Z3689 Encounter for other specified antenatal screening: Secondary | ICD-10-CM

## 2010-07-19 ENCOUNTER — Other Ambulatory Visit: Payer: Self-pay | Admitting: Obstetrics and Gynecology

## 2010-07-19 DIAGNOSIS — O169 Unspecified maternal hypertension, unspecified trimester: Secondary | ICD-10-CM

## 2010-07-19 DIAGNOSIS — O34219 Maternal care for unspecified type scar from previous cesarean delivery: Secondary | ICD-10-CM

## 2010-07-19 DIAGNOSIS — O99210 Obesity complicating pregnancy, unspecified trimester: Secondary | ICD-10-CM

## 2010-07-19 DIAGNOSIS — O0930 Supervision of pregnancy with insufficient antenatal care, unspecified trimester: Secondary | ICD-10-CM

## 2010-07-19 DIAGNOSIS — Z331 Pregnant state, incidental: Secondary | ICD-10-CM

## 2010-07-19 DIAGNOSIS — E669 Obesity, unspecified: Secondary | ICD-10-CM

## 2010-07-19 LAB — POCT URINALYSIS DIP (DEVICE)
Protein, ur: 30 mg/dL — AB
Specific Gravity, Urine: 1.025 (ref 1.005–1.030)
Urobilinogen, UA: 2 mg/dL — ABNORMAL HIGH (ref 0.0–1.0)
pH: 6.5 (ref 5.0–8.0)

## 2010-08-07 ENCOUNTER — Inpatient Hospital Stay (HOSPITAL_COMMUNITY)
Admission: AD | Admit: 2010-08-07 | Discharge: 2010-08-07 | Disposition: A | Discharge status: Home / Self Care | Payer: Medicaid Other | Source: Ambulatory Visit | Attending: Obstetrics and Gynecology | Admitting: Obstetrics and Gynecology

## 2010-08-07 DIAGNOSIS — O99891 Other specified diseases and conditions complicating pregnancy: Secondary | ICD-10-CM | POA: Insufficient documentation

## 2010-08-07 DIAGNOSIS — R51 Headache: Secondary | ICD-10-CM | POA: Insufficient documentation

## 2010-08-07 DIAGNOSIS — E876 Hypokalemia: Secondary | ICD-10-CM | POA: Insufficient documentation

## 2010-08-07 LAB — CBC
HCT: 28.4 % — ABNORMAL LOW (ref 36.0–46.0)
MCH: 22.9 pg — ABNORMAL LOW (ref 26.0–34.0)
MCHC: 29.9 g/dL — ABNORMAL LOW (ref 30.0–36.0)
MCV: 76.5 fL — ABNORMAL LOW (ref 78.0–100.0)
Platelets: 251 10*3/uL (ref 150–400)
RDW: 15.3 % (ref 11.5–15.5)
WBC: 9.7 10*3/uL (ref 4.0–10.5)

## 2010-08-07 LAB — URINALYSIS, ROUTINE W REFLEX MICROSCOPIC
Bilirubin Urine: NEGATIVE
Ketones, ur: NEGATIVE mg/dL
Nitrite: NEGATIVE
Specific Gravity, Urine: 1.025 (ref 1.005–1.030)
Urobilinogen, UA: 4 mg/dL — ABNORMAL HIGH (ref 0.0–1.0)
pH: 6.5 (ref 5.0–8.0)

## 2010-08-07 LAB — COMPREHENSIVE METABOLIC PANEL
Albumin: 2.1 g/dL — ABNORMAL LOW (ref 3.5–5.2)
BUN: <3 mg/dL — ABNORMAL LOW (ref 6–23)
Calcium: 9.2 mg/dL (ref 8.4–10.5)
Creatinine, Ser: 0.49 mg/dL (ref 0.4–1.2)
Glucose, Bld: 110 mg/dL — ABNORMAL HIGH (ref 70–99)
Total Protein: 6 g/dL (ref 6.0–8.3)

## 2010-08-07 LAB — URIC ACID: Uric Acid, Serum: 5.5 mg/dL (ref 2.4–7.0)

## 2010-08-07 LAB — URINE MICROSCOPIC-ADD ON

## 2010-08-07 LAB — LACTATE DEHYDROGENASE: LDH: 158 U/L (ref 94–250)

## 2010-08-08 LAB — URINE CULTURE: Culture  Setup Time: 201205221045

## 2010-08-09 ENCOUNTER — Other Ambulatory Visit: Payer: Self-pay | Admitting: Obstetrics & Gynecology

## 2010-08-09 DIAGNOSIS — O34219 Maternal care for unspecified type scar from previous cesarean delivery: Secondary | ICD-10-CM

## 2010-08-09 DIAGNOSIS — O169 Unspecified maternal hypertension, unspecified trimester: Secondary | ICD-10-CM

## 2010-08-09 DIAGNOSIS — O2390 Unspecified genitourinary tract infection in pregnancy, unspecified trimester: Secondary | ICD-10-CM

## 2010-08-09 DIAGNOSIS — O0930 Supervision of pregnancy with insufficient antenatal care, unspecified trimester: Secondary | ICD-10-CM

## 2010-08-09 LAB — POCT URINALYSIS DIP (DEVICE)
Glucose, UA: NEGATIVE mg/dL
Protein, ur: NEGATIVE mg/dL
Specific Gravity, Urine: 1.02 (ref 1.005–1.030)
Urobilinogen, UA: 0.2 mg/dL (ref 0.0–1.0)

## 2010-08-14 ENCOUNTER — Inpatient Hospital Stay (HOSPITAL_COMMUNITY)
Admission: AD | Admit: 2010-08-14 | Discharge: 2010-08-14 | Disposition: A | Discharge status: Home / Self Care | Payer: Medicaid Other | Source: Ambulatory Visit | Attending: Family Medicine | Admitting: Family Medicine

## 2010-08-14 DIAGNOSIS — O2390 Unspecified genitourinary tract infection in pregnancy, unspecified trimester: Secondary | ICD-10-CM | POA: Insufficient documentation

## 2010-08-14 DIAGNOSIS — B373 Candidiasis of vulva and vagina: Secondary | ICD-10-CM

## 2010-08-14 DIAGNOSIS — R109 Unspecified abdominal pain: Secondary | ICD-10-CM

## 2010-08-14 DIAGNOSIS — G44209 Tension-type headache, unspecified, not intractable: Secondary | ICD-10-CM | POA: Insufficient documentation

## 2010-08-14 DIAGNOSIS — B3731 Acute candidiasis of vulva and vagina: Secondary | ICD-10-CM | POA: Insufficient documentation

## 2010-08-14 DIAGNOSIS — O10019 Pre-existing essential hypertension complicating pregnancy, unspecified trimester: Secondary | ICD-10-CM

## 2010-08-14 LAB — COMPREHENSIVE METABOLIC PANEL
ALT: 8 U/L (ref 0–35)
BUN: 3 mg/dL — ABNORMAL LOW (ref 6–23)
CO2: 22 mEq/L (ref 19–32)
Calcium: 9.1 mg/dL (ref 8.4–10.5)
Creatinine, Ser: <0.47 mg/dL (ref 0.4–1.2)
Glucose, Bld: 96 mg/dL (ref 70–99)
Sodium: 138 mEq/L (ref 135–145)

## 2010-08-14 LAB — URINALYSIS, ROUTINE W REFLEX MICROSCOPIC
Nitrite: NEGATIVE
Specific Gravity, Urine: 1.02 (ref 1.005–1.030)
Urobilinogen, UA: 1 mg/dL (ref 0.0–1.0)
pH: 7 (ref 5.0–8.0)

## 2010-08-14 LAB — CBC
Platelets: 240 10*3/uL (ref 150–400)
RBC: 4.09 MIL/uL (ref 3.87–5.11)
RDW: 15.6 % — ABNORMAL HIGH (ref 11.5–15.5)
WBC: 9.5 10*3/uL (ref 4.0–10.5)

## 2010-08-14 LAB — FETAL FIBRONECTIN: Fetal Fibronectin: NEGATIVE

## 2010-08-14 LAB — WET PREP, GENITAL: Trich, Wet Prep: NONE SEEN

## 2010-08-14 LAB — URINE MICROSCOPIC-ADD ON

## 2010-08-14 LAB — PROTEIN / CREATININE RATIO, URINE: Total Protein, Urine: 32.6 mg/dL

## 2010-08-22 LAB — STREP B DNA PROBE: GBS: POSITIVE

## 2010-08-23 ENCOUNTER — Inpatient Hospital Stay (HOSPITAL_COMMUNITY)
Admission: EM | Admit: 2010-08-23 | Discharge: 2010-08-23 | Disposition: A | Discharge status: Home / Self Care | Payer: Self-pay | Source: Ambulatory Visit | Attending: Obstetrics & Gynecology | Admitting: Obstetrics & Gynecology

## 2010-08-23 ENCOUNTER — Inpatient Hospital Stay (HOSPITAL_COMMUNITY): Payer: Self-pay

## 2010-08-23 DIAGNOSIS — O0930 Supervision of pregnancy with insufficient antenatal care, unspecified trimester: Secondary | ICD-10-CM

## 2010-08-23 DIAGNOSIS — O169 Unspecified maternal hypertension, unspecified trimester: Secondary | ICD-10-CM

## 2010-08-23 DIAGNOSIS — O34219 Maternal care for unspecified type scar from previous cesarean delivery: Secondary | ICD-10-CM

## 2010-08-23 DIAGNOSIS — O139 Gestational [pregnancy-induced] hypertension without significant proteinuria, unspecified trimester: Secondary | ICD-10-CM | POA: Insufficient documentation

## 2010-08-23 LAB — CBC
HCT: 28.8 % — ABNORMAL LOW (ref 36.0–46.0)
MCV: 75 fL — ABNORMAL LOW (ref 78.0–100.0)
RDW: 16.1 % — ABNORMAL HIGH (ref 11.5–15.5)
WBC: 8.2 10*3/uL (ref 4.0–10.5)

## 2010-08-23 LAB — PROTEIN / CREATININE RATIO, URINE
Creatinine, Urine: 65.36 mg/dL
Total Protein, Urine: 9.7 mg/dL

## 2010-08-23 LAB — COMPREHENSIVE METABOLIC PANEL
Alkaline Phosphatase: 195 U/L — ABNORMAL HIGH (ref 39–117)
BUN: <3 mg/dL — ABNORMAL LOW (ref 6–23)
Chloride: 99 mEq/L (ref 96–112)
Glucose, Bld: 148 mg/dL — ABNORMAL HIGH (ref 70–99)
Potassium: 2.8 mEq/L — ABNORMAL LOW (ref 3.5–5.1)
Total Bilirubin: 0.3 mg/dL (ref 0.3–1.2)

## 2010-08-24 LAB — POCT URINALYSIS DIP (DEVICE)
Nitrite: NEGATIVE
Protein, ur: 100 mg/dL — AB
pH: 7 (ref 5.0–8.0)

## 2010-08-29 ENCOUNTER — Inpatient Hospital Stay (HOSPITAL_COMMUNITY)
Admission: AD | Admit: 2010-08-29 | Discharge: 2010-08-29 | Disposition: A | Discharge status: Home / Self Care | Payer: Self-pay | Source: Ambulatory Visit | Attending: Obstetrics & Gynecology | Admitting: Obstetrics & Gynecology

## 2010-08-29 DIAGNOSIS — O9989 Other specified diseases and conditions complicating pregnancy, childbirth and the puerperium: Secondary | ICD-10-CM

## 2010-08-29 DIAGNOSIS — R109 Unspecified abdominal pain: Secondary | ICD-10-CM | POA: Insufficient documentation

## 2010-08-29 DIAGNOSIS — O99891 Other specified diseases and conditions complicating pregnancy: Secondary | ICD-10-CM | POA: Insufficient documentation

## 2010-08-29 LAB — URINALYSIS, ROUTINE W REFLEX MICROSCOPIC
Glucose, UA: NEGATIVE mg/dL
Ketones, ur: NEGATIVE mg/dL
pH: 7 (ref 5.0–8.0)

## 2010-08-29 LAB — URINE MICROSCOPIC-ADD ON

## 2010-08-29 LAB — CBC
MCHC: 30.3 g/dL (ref 30.0–36.0)
RDW: 15.9 % — ABNORMAL HIGH (ref 11.5–15.5)

## 2010-08-29 LAB — COMPREHENSIVE METABOLIC PANEL
ALT: 8 U/L (ref 0–35)
Albumin: 2.4 g/dL — ABNORMAL LOW (ref 3.5–5.2)
Alkaline Phosphatase: 198 U/L — ABNORMAL HIGH (ref 39–117)
BUN: <3 mg/dL — ABNORMAL LOW (ref 6–23)
Potassium: 2.9 mEq/L — ABNORMAL LOW (ref 3.5–5.1)
Sodium: 135 mEq/L (ref 135–145)
Total Protein: 6.9 g/dL (ref 6.0–8.3)

## 2010-09-04 ENCOUNTER — Inpatient Hospital Stay (HOSPITAL_COMMUNITY)
Admission: AD | Admit: 2010-09-04 | Discharge: 2010-09-04 | Disposition: A | Discharge status: Home / Self Care | Payer: Self-pay | Source: Ambulatory Visit | Attending: Obstetrics & Gynecology | Admitting: Obstetrics & Gynecology

## 2010-09-04 DIAGNOSIS — F3289 Other specified depressive episodes: Secondary | ICD-10-CM | POA: Insufficient documentation

## 2010-09-04 DIAGNOSIS — O99891 Other specified diseases and conditions complicating pregnancy: Secondary | ICD-10-CM | POA: Insufficient documentation

## 2010-09-04 DIAGNOSIS — K219 Gastro-esophageal reflux disease without esophagitis: Secondary | ICD-10-CM | POA: Insufficient documentation

## 2010-09-04 DIAGNOSIS — O99340 Other mental disorders complicating pregnancy, unspecified trimester: Secondary | ICD-10-CM | POA: Insufficient documentation

## 2010-09-04 DIAGNOSIS — R55 Syncope and collapse: Secondary | ICD-10-CM | POA: Insufficient documentation

## 2010-09-04 DIAGNOSIS — F329 Major depressive disorder, single episode, unspecified: Secondary | ICD-10-CM | POA: Insufficient documentation

## 2010-09-04 DIAGNOSIS — O169 Unspecified maternal hypertension, unspecified trimester: Secondary | ICD-10-CM | POA: Insufficient documentation

## 2010-09-04 LAB — WET PREP, GENITAL: Yeast Wet Prep HPF POC: NONE SEEN

## 2010-09-04 LAB — URINALYSIS, ROUTINE W REFLEX MICROSCOPIC
Bilirubin Urine: NEGATIVE
Glucose, UA: NEGATIVE mg/dL
Hgb urine dipstick: NEGATIVE
Specific Gravity, Urine: 1.01 (ref 1.005–1.030)
pH: 7 (ref 5.0–8.0)

## 2010-09-04 LAB — URINE MICROSCOPIC-ADD ON

## 2010-09-04 LAB — CBC
HCT: 28.3 % — ABNORMAL LOW (ref 36.0–46.0)
Hemoglobin: 8.6 g/dL — ABNORMAL LOW (ref 12.0–15.0)
MCV: 72.6 fL — ABNORMAL LOW (ref 78.0–100.0)
RBC: 3.9 MIL/uL (ref 3.87–5.11)
RDW: 16.3 % — ABNORMAL HIGH (ref 11.5–15.5)
WBC: 8.4 10*3/uL (ref 4.0–10.5)

## 2010-09-13 ENCOUNTER — Inpatient Hospital Stay (HOSPITAL_COMMUNITY): Payer: Self-pay

## 2010-09-13 ENCOUNTER — Inpatient Hospital Stay (HOSPITAL_COMMUNITY)
Admission: AD | Admit: 2010-09-13 | Discharge: 2010-09-13 | Disposition: A | Discharge status: Home / Self Care | Payer: Self-pay | Source: Ambulatory Visit | Attending: Family Medicine | Admitting: Family Medicine

## 2010-09-13 ENCOUNTER — Encounter (HOSPITAL_COMMUNITY): Payer: Self-pay | Admitting: Radiology

## 2010-09-13 ENCOUNTER — Other Ambulatory Visit: Payer: Self-pay | Admitting: Obstetrics & Gynecology

## 2010-09-13 DIAGNOSIS — R079 Chest pain, unspecified: Secondary | ICD-10-CM | POA: Insufficient documentation

## 2010-09-13 DIAGNOSIS — O34219 Maternal care for unspecified type scar from previous cesarean delivery: Secondary | ICD-10-CM

## 2010-09-13 DIAGNOSIS — O99891 Other specified diseases and conditions complicating pregnancy: Secondary | ICD-10-CM

## 2010-09-13 DIAGNOSIS — O169 Unspecified maternal hypertension, unspecified trimester: Secondary | ICD-10-CM

## 2010-09-13 DIAGNOSIS — O99210 Obesity complicating pregnancy, unspecified trimester: Secondary | ICD-10-CM

## 2010-09-13 DIAGNOSIS — O9989 Other specified diseases and conditions complicating pregnancy, childbirth and the puerperium: Secondary | ICD-10-CM

## 2010-09-13 DIAGNOSIS — K219 Gastro-esophageal reflux disease without esophagitis: Secondary | ICD-10-CM | POA: Insufficient documentation

## 2010-09-13 DIAGNOSIS — E669 Obesity, unspecified: Secondary | ICD-10-CM

## 2010-09-13 DIAGNOSIS — O0930 Supervision of pregnancy with insufficient antenatal care, unspecified trimester: Secondary | ICD-10-CM

## 2010-09-13 LAB — COMPREHENSIVE METABOLIC PANEL
ALT: 9 U/L (ref 0–35)
AST: 22 U/L (ref 0–37)
Alkaline Phosphatase: 194 U/L — ABNORMAL HIGH (ref 39–117)
CO2: 23 mEq/L (ref 19–32)
Glucose, Bld: 109 mg/dL — ABNORMAL HIGH (ref 70–99)
Potassium: 2.5 mEq/L — CL (ref 3.5–5.1)
Sodium: 136 mEq/L (ref 135–145)
Total Protein: 6.3 g/dL (ref 6.0–8.3)

## 2010-09-13 LAB — POCT URINALYSIS DIP (DEVICE)
Glucose, UA: NEGATIVE mg/dL
Hgb urine dipstick: NEGATIVE
Nitrite: NEGATIVE
Protein, ur: 100 mg/dL — AB
Specific Gravity, Urine: 1.02 (ref 1.005–1.030)
Urobilinogen, UA: 2 mg/dL — ABNORMAL HIGH (ref 0.0–1.0)
pH: 7 (ref 5.0–8.0)

## 2010-09-13 LAB — URINALYSIS, ROUTINE W REFLEX MICROSCOPIC
Bilirubin Urine: NEGATIVE
Hgb urine dipstick: NEGATIVE
Ketones, ur: NEGATIVE mg/dL
Nitrite: NEGATIVE
Specific Gravity, Urine: 1.01 (ref 1.005–1.030)
pH: 7 (ref 5.0–8.0)

## 2010-09-13 LAB — URINE MICROSCOPIC-ADD ON

## 2010-09-13 LAB — CBC
Hemoglobin: 8.3 g/dL — ABNORMAL LOW (ref 12.0–15.0)
Platelets: 213 10*3/uL (ref 150–400)
RBC: 3.83 MIL/uL — ABNORMAL LOW (ref 3.87–5.11)

## 2010-09-16 ENCOUNTER — Inpatient Hospital Stay (HOSPITAL_COMMUNITY)
Admission: AD | Admit: 2010-09-16 | Discharge: 2010-09-16 | Disposition: A | Discharge status: Home / Self Care | Payer: Self-pay | Source: Ambulatory Visit | Attending: Obstetrics & Gynecology | Admitting: Obstetrics & Gynecology

## 2010-09-16 DIAGNOSIS — A499 Bacterial infection, unspecified: Secondary | ICD-10-CM | POA: Insufficient documentation

## 2010-09-16 DIAGNOSIS — O2390 Unspecified genitourinary tract infection in pregnancy, unspecified trimester: Secondary | ICD-10-CM | POA: Insufficient documentation

## 2010-09-16 DIAGNOSIS — N760 Acute vaginitis: Secondary | ICD-10-CM | POA: Insufficient documentation

## 2010-09-16 DIAGNOSIS — B9689 Other specified bacterial agents as the cause of diseases classified elsewhere: Secondary | ICD-10-CM | POA: Insufficient documentation

## 2010-09-16 LAB — CBC
HCT: 27.3 % — ABNORMAL LOW (ref 36.0–46.0)
Hemoglobin: 8.1 g/dL — ABNORMAL LOW (ref 12.0–15.0)
MCH: 21.3 pg — ABNORMAL LOW (ref 26.0–34.0)
MCV: 71.8 fL — ABNORMAL LOW (ref 78.0–100.0)
Platelets: 226 10*3/uL (ref 150–400)
RBC: 3.8 MIL/uL — ABNORMAL LOW (ref 3.87–5.11)
WBC: 7.5 10*3/uL (ref 4.0–10.5)

## 2010-09-16 LAB — COMPREHENSIVE METABOLIC PANEL
AST: 20 U/L (ref 0–37)
BUN: <3 mg/dL — ABNORMAL LOW (ref 6–23)
CO2: 25 mEq/L (ref 19–32)
Calcium: 9.1 mg/dL (ref 8.4–10.5)
Chloride: 98 mEq/L (ref 96–112)
Creatinine, Ser: 0.5 mg/dL (ref 0.50–1.10)
GFR calc Af Amer: >60 mL/min (ref >60)
GFR calc non Af Amer: >60 mL/min (ref >60)
Glucose, Bld: 117 mg/dL — ABNORMAL HIGH (ref 70–99)
Total Bilirubin: 0.3 mg/dL (ref 0.3–1.2)

## 2010-09-16 LAB — RAPID URINE DRUG SCREEN, HOSP PERFORMED
Barbiturates: NOT DETECTED
Benzodiazepines: NOT DETECTED
Cocaine: NOT DETECTED

## 2010-09-16 LAB — URINALYSIS, ROUTINE W REFLEX MICROSCOPIC
Glucose, UA: NEGATIVE mg/dL
Ketones, ur: NEGATIVE mg/dL
Nitrite: NEGATIVE
Specific Gravity, Urine: 1.01 (ref 1.005–1.030)
pH: 7 (ref 5.0–8.0)

## 2010-09-16 LAB — URINE MICROSCOPIC-ADD ON

## 2010-09-16 LAB — PROTEIN / CREATININE RATIO, URINE: Protein Creatinine Ratio: 0.18 — ABNORMAL HIGH (ref 0.00–0.15)

## 2010-09-20 ENCOUNTER — Other Ambulatory Visit: Payer: Self-pay | Admitting: Obstetrics and Gynecology

## 2010-09-20 ENCOUNTER — Other Ambulatory Visit: Payer: Self-pay | Admitting: Obstetrics & Gynecology

## 2010-09-20 ENCOUNTER — Encounter (HOSPITAL_COMMUNITY): Payer: Self-pay | Admitting: *Deleted

## 2010-09-20 DIAGNOSIS — I10 Essential (primary) hypertension: Secondary | ICD-10-CM

## 2010-09-20 DIAGNOSIS — O0930 Supervision of pregnancy with insufficient antenatal care, unspecified trimester: Secondary | ICD-10-CM

## 2010-09-20 DIAGNOSIS — O169 Unspecified maternal hypertension, unspecified trimester: Secondary | ICD-10-CM

## 2010-09-20 DIAGNOSIS — O34219 Maternal care for unspecified type scar from previous cesarean delivery: Secondary | ICD-10-CM

## 2010-09-20 LAB — POCT URINALYSIS DIP (DEVICE)
Bilirubin Urine: NEGATIVE
Glucose, UA: NEGATIVE mg/dL
Nitrite: NEGATIVE
Specific Gravity, Urine: <=1.005 (ref 1.005–1.030)
Urobilinogen, UA: 0.2 mg/dL (ref 0.0–1.0)

## 2010-09-21 ENCOUNTER — Ambulatory Visit (HOSPITAL_COMMUNITY): Payer: Self-pay | Attending: Obstetrics and Gynecology

## 2010-10-04 ENCOUNTER — Other Ambulatory Visit: Payer: Self-pay | Admitting: Family Medicine

## 2010-10-04 ENCOUNTER — Other Ambulatory Visit: Payer: Self-pay | Admitting: Obstetrics & Gynecology

## 2010-10-04 ENCOUNTER — Ambulatory Visit (INDEPENDENT_AMBULATORY_CARE_PROVIDER_SITE_OTHER): Payer: Self-pay | Admitting: Family Medicine

## 2010-10-04 DIAGNOSIS — O169 Unspecified maternal hypertension, unspecified trimester: Secondary | ICD-10-CM

## 2010-10-04 LAB — COMPREHENSIVE METABOLIC PANEL
Albumin: 2.9 g/dL — ABNORMAL LOW (ref 3.5–5.2)
BUN: 3 mg/dL — ABNORMAL LOW (ref 6–23)
CO2: 19 mEq/L (ref 19–32)
Calcium: 9 mg/dL (ref 8.4–10.5)
Chloride: 104 mEq/L (ref 96–112)
Glucose, Bld: 130 mg/dL — ABNORMAL HIGH (ref 70–99)
Potassium: 3 mEq/L — ABNORMAL LOW (ref 3.5–5.3)
Sodium: 137 mEq/L (ref 135–145)
Total Protein: 6 g/dL (ref 6.0–8.3)

## 2010-10-04 LAB — POCT URINALYSIS DIP (DEVICE)
Bilirubin Urine: NEGATIVE
Glucose, UA: NEGATIVE mg/dL
Ketones, ur: NEGATIVE mg/dL
Nitrite: NEGATIVE

## 2010-10-04 LAB — CBC
HCT: 27.3 % — ABNORMAL LOW (ref 36.0–46.0)
Hemoglobin: 8.1 g/dL — ABNORMAL LOW (ref 12.0–15.0)
RBC: 4.01 MIL/uL (ref 3.87–5.11)

## 2010-10-05 LAB — GC/CHLAMYDIA PROBE AMP, GENITAL
Chlamydia, DNA Probe: NEGATIVE
GC Probe Amp, Genital: NEGATIVE

## 2010-10-05 LAB — DRUGS OF ABUSE SCREEN W/O ALC, ROUTINE URINE
Benzodiazepines.: NEGATIVE
Creatinine,U: 85.1 mg/dL
Opiate Screen, Urine: NEGATIVE
Phencyclidine (PCP): NEGATIVE
Propoxyphene: NEGATIVE

## 2010-10-06 LAB — STREP B DNA PROBE: GBSP: NEGATIVE

## 2010-10-08 ENCOUNTER — Ambulatory Visit: Payer: Self-pay | Admitting: *Deleted

## 2010-10-08 ENCOUNTER — Ambulatory Visit (HOSPITAL_COMMUNITY)
Admission: RE | Admit: 2010-10-08 | Discharge: 2010-10-08 | Disposition: A | Discharge status: Home / Self Care | Payer: Medicaid Other | Source: Ambulatory Visit | Attending: Obstetrics and Gynecology | Admitting: Obstetrics and Gynecology

## 2010-10-08 DIAGNOSIS — O99210 Obesity complicating pregnancy, unspecified trimester: Secondary | ICD-10-CM | POA: Insufficient documentation

## 2010-10-08 DIAGNOSIS — O10019 Pre-existing essential hypertension complicating pregnancy, unspecified trimester: Secondary | ICD-10-CM | POA: Insufficient documentation

## 2010-10-08 DIAGNOSIS — O169 Unspecified maternal hypertension, unspecified trimester: Secondary | ICD-10-CM

## 2010-10-08 DIAGNOSIS — Z8751 Personal history of pre-term labor: Secondary | ICD-10-CM | POA: Insufficient documentation

## 2010-10-08 DIAGNOSIS — I10 Essential (primary) hypertension: Secondary | ICD-10-CM

## 2010-10-08 DIAGNOSIS — E669 Obesity, unspecified: Secondary | ICD-10-CM | POA: Insufficient documentation

## 2010-10-08 DIAGNOSIS — O34219 Maternal care for unspecified type scar from previous cesarean delivery: Secondary | ICD-10-CM | POA: Insufficient documentation

## 2010-10-11 ENCOUNTER — Other Ambulatory Visit: Payer: Self-pay

## 2010-10-12 ENCOUNTER — Encounter (HOSPITAL_COMMUNITY): Payer: Self-pay | Admitting: *Deleted

## 2010-10-12 ENCOUNTER — Inpatient Hospital Stay (HOSPITAL_COMMUNITY): Payer: Medicaid Other | Admitting: Anesthesiology

## 2010-10-12 ENCOUNTER — Other Ambulatory Visit: Payer: Self-pay

## 2010-10-12 ENCOUNTER — Encounter (HOSPITAL_COMMUNITY): Payer: Self-pay | Admitting: Anesthesiology

## 2010-10-12 ENCOUNTER — Encounter (HOSPITAL_COMMUNITY)
Admission: AD | Disposition: A | Discharge status: Home / Self Care | Payer: Self-pay | Source: Ambulatory Visit | Attending: Obstetrics & Gynecology

## 2010-10-12 ENCOUNTER — Other Ambulatory Visit: Payer: Self-pay | Admitting: Obstetrics & Gynecology

## 2010-10-12 ENCOUNTER — Inpatient Hospital Stay (HOSPITAL_COMMUNITY)
Admission: AD | Admit: 2010-10-12 | Discharge: 2010-10-15 | DRG: 765 | Disposition: A | Discharge status: Home / Self Care | Payer: Medicaid Other | Source: Ambulatory Visit | Attending: Obstetrics & Gynecology | Admitting: Obstetrics & Gynecology

## 2010-10-12 DIAGNOSIS — O34219 Maternal care for unspecified type scar from previous cesarean delivery: Principal | ICD-10-CM

## 2010-10-12 DIAGNOSIS — IMO0002 Reserved for concepts with insufficient information to code with codable children: Secondary | ICD-10-CM | POA: Diagnosis present

## 2010-10-12 DIAGNOSIS — O99814 Abnormal glucose complicating childbirth: Secondary | ICD-10-CM | POA: Diagnosis present

## 2010-10-12 DIAGNOSIS — E876 Hypokalemia: Secondary | ICD-10-CM

## 2010-10-12 DIAGNOSIS — O9903 Anemia complicating the puerperium: Secondary | ICD-10-CM

## 2010-10-12 DIAGNOSIS — D649 Anemia, unspecified: Secondary | ICD-10-CM | POA: Diagnosis not present

## 2010-10-12 DIAGNOSIS — O24420 Gestational diabetes mellitus in childbirth, diet controlled: Secondary | ICD-10-CM

## 2010-10-12 DIAGNOSIS — O1400 Mild to moderate pre-eclampsia, unspecified trimester: Secondary | ICD-10-CM

## 2010-10-12 HISTORY — DX: Gestational diabetes mellitus in pregnancy, unspecified control: O24.419

## 2010-10-12 HISTORY — DX: Hypokalemia: E87.6

## 2010-10-12 HISTORY — DX: Anemia, unspecified: D64.9

## 2010-10-12 LAB — PROTEIN / CREATININE RATIO, URINE
Creatinine, Urine: 115.08 mg/dL
Total Protein, Urine: 36.6 mg/dL

## 2010-10-12 LAB — COMPREHENSIVE METABOLIC PANEL
AST: 21 U/L (ref 0–37)
CO2: 25 mEq/L (ref 19–32)
Calcium: 9.3 mg/dL (ref 8.4–10.5)
Creatinine, Ser: 0.57 mg/dL (ref 0.50–1.10)
GFR calc non Af Amer: >60 mL/min (ref >60)

## 2010-10-12 LAB — URINE MICROSCOPIC-ADD ON

## 2010-10-12 LAB — CBC
HCT: 26 % — ABNORMAL LOW (ref 36.0–46.0)
Hemoglobin: 7.9 g/dL — ABNORMAL LOW (ref 12.0–15.0)
MCH: 19.5 pg — ABNORMAL LOW (ref 26.0–34.0)
MCHC: 28.5 g/dL — ABNORMAL LOW (ref 30.0–36.0)
MCV: 68.1 fL — ABNORMAL LOW (ref 78.0–100.0)
Platelets: 189 10*3/uL (ref 150–400)
RBC: 4.05 MIL/uL (ref 3.87–5.11)
RDW: 17.6 % — ABNORMAL HIGH (ref 11.5–15.5)
WBC: 8.2 10*3/uL (ref 4.0–10.5)

## 2010-10-12 LAB — URINALYSIS, ROUTINE W REFLEX MICROSCOPIC
Nitrite: NEGATIVE
Protein, ur: NEGATIVE mg/dL
Urobilinogen, UA: 4 mg/dL — ABNORMAL HIGH (ref 0.0–1.0)

## 2010-10-12 SURGERY — Surgical Case
Anesthesia: Regional | Site: Abdomen | Wound class: Clean Contaminated

## 2010-10-12 SURGERY — Surgical Case
Anesthesia: Regional

## 2010-10-12 MED ORDER — ONDANSETRON HCL 4 MG PO TABS: 4.0000 mg | ORAL_TABLET | ORAL | Status: DC | PRN

## 2010-10-12 MED ORDER — POTASSIUM CHLORIDE CRYS ER 20 MEQ PO TBCR
40.0000 meq | EXTENDED_RELEASE_TABLET | Freq: Two times a day (BID) | ORAL | Status: DC
  Administered 2010-10-12: 40 meq via ORAL
  Filled 2010-10-12 (×3): qty 2

## 2010-10-12 MED ORDER — ONDANSETRON HCL 4 MG/2ML IJ SOLN
INTRAMUSCULAR | Status: DC | PRN
  Administered 2010-10-12: 4 mg via INTRAVENOUS

## 2010-10-12 MED ORDER — TETANUS-DIPHTH-ACELL PERTUSSIS 5-2.5-18.5 LF-MCG/0.5 IM SUSP
0.5000 mL | Freq: Once | INTRAMUSCULAR | Status: DC
  Filled 2010-10-12: qty 0.5

## 2010-10-12 MED ORDER — SCOPOLAMINE 1 MG/3DAYS TD PT72
1.0000 | MEDICATED_PATCH | Freq: Once | TRANSDERMAL | Status: DC
  Administered 2010-10-12: 1.5 mg via TRANSDERMAL

## 2010-10-12 MED ORDER — DIPHENHYDRAMINE HCL 25 MG PO CAPS: 25.0000 mg | ORAL_CAPSULE | Freq: Four times a day (QID) | ORAL | Status: DC | PRN

## 2010-10-12 MED ORDER — MENTHOL 3 MG MT LOZG: 1.0000 | LOZENGE | OROMUCOSAL | Status: DC | PRN

## 2010-10-12 MED ORDER — FAMOTIDINE IN NACL 20-0.9 MG/50ML-% IV SOLN
INTRAVENOUS | Status: AC
  Filled 2010-10-12: qty 50

## 2010-10-12 MED ORDER — LACTATED RINGERS IV SOLN
INTRAVENOUS | Status: DC | PRN
  Administered 2010-10-12 (×2): via INTRAVENOUS
  Administered 2010-10-12: 21:00:00

## 2010-10-12 MED ORDER — LABETALOL HCL 100 MG PO TABS
200.0000 mg | ORAL_TABLET | Freq: Once | ORAL | Status: AC
  Administered 2010-10-12: 200 mg via ORAL
  Filled 2010-10-12: qty 2

## 2010-10-12 MED ORDER — FAMOTIDINE IN NACL 20-0.9 MG/50ML-% IV SOLN: 20.0000 mg | Freq: Once | INTRAVENOUS | Status: DC

## 2010-10-12 MED ORDER — PRENATAL PLUS 27-1 MG PO TABS: 1.0000 | ORAL_TABLET | Freq: Every day | ORAL | Status: DC

## 2010-10-12 MED ORDER — MAGNESIUM SULFATE 50 % IJ SOLN: 5.0000 g | Freq: Once | INTRAVENOUS | Status: DC

## 2010-10-12 MED ORDER — OXYTOCIN 10 UNIT/ML IJ SOLN
INTRAMUSCULAR | Status: AC
  Filled 2010-10-12: qty 2

## 2010-10-12 MED ORDER — SIMETHICONE 80 MG PO CHEW
80.0000 mg | CHEWABLE_TABLET | Freq: Three times a day (TID) | ORAL | Status: DC
  Administered 2010-10-12 – 2010-10-15 (×10): 80 mg via ORAL

## 2010-10-12 MED ORDER — POTASSIUM CHLORIDE CRYS ER 20 MEQ PO TBCR
20.0000 meq | EXTENDED_RELEASE_TABLET | Freq: Two times a day (BID) | ORAL | Status: DC
  Administered 2010-10-12 – 2010-10-15 (×6): 20 meq via ORAL
  Filled 2010-10-12 (×8): qty 1

## 2010-10-12 MED ORDER — CEFAZOLIN SODIUM 1-5 GM-% IV SOLN
INTRAVENOUS | Status: AC
  Filled 2010-10-12: qty 50

## 2010-10-12 MED ORDER — CEFAZOLIN SODIUM 1-5 GM-% IV SOLN
INTRAVENOUS | Status: DC | PRN
  Administered 2010-10-12: 1 g via INTRAVENOUS

## 2010-10-12 MED ORDER — SODIUM CHLORIDE 0.9 % IV BOLUS (SEPSIS)
500.0000 mL | Freq: Once | INTRAVENOUS | Status: AC
  Administered 2010-10-12: 1000 mL via INTRAVENOUS

## 2010-10-12 MED ORDER — SENNOSIDES-DOCUSATE SODIUM 8.6-50 MG PO TABS
1.0000 | ORAL_TABLET | Freq: Every day | ORAL | Status: DC
  Administered 2010-10-12 – 2010-10-13 (×2): 1 via ORAL
  Administered 2010-10-14: 2 via ORAL

## 2010-10-12 MED ORDER — MAGNESIUM SULFATE 50 % IJ SOLN: 2.0000 g | INTRAVENOUS | Status: DC

## 2010-10-12 MED ORDER — BUPIVACAINE IN DEXTROSE 0.75-8.25 % IT SOLN
INTRATHECAL | Status: DC | PRN
  Administered 2010-10-12: 1.5 mL via INTRATHECAL

## 2010-10-12 MED ORDER — SCOPOLAMINE 1 MG/3DAYS TD PT72
MEDICATED_PATCH | TRANSDERMAL | Status: AC
  Administered 2010-10-12: 1.5 mg via TRANSDERMAL
  Filled 2010-10-12: qty 1

## 2010-10-12 MED ORDER — SIMETHICONE 80 MG PO CHEW: 80.0000 mg | CHEWABLE_TABLET | ORAL | Status: DC | PRN

## 2010-10-12 MED ORDER — KETOROLAC TROMETHAMINE 60 MG/2ML IM SOLN
60.0000 mg | Freq: Once | INTRAMUSCULAR | Status: AC | PRN
  Administered 2010-10-12: 60 mg via INTRAMUSCULAR

## 2010-10-12 MED ORDER — WITCH HAZEL-GLYCERIN EX PADS: MEDICATED_PAD | CUTANEOUS | Status: DC | PRN

## 2010-10-12 MED ORDER — MAGNESIUM SULFATE BOLUS VIA INFUSION
4.0000 g | Freq: Once | INTRAVENOUS | Status: AC
  Administered 2010-10-12: 4 g via INTRAVENOUS
  Filled 2010-10-12: qty 500

## 2010-10-12 MED ORDER — LIDOCAINE-EPINEPHRINE (PF) 2 %-1:200000 IJ SOLN
INTRAMUSCULAR | Status: AC
  Filled 2010-10-12: qty 20

## 2010-10-12 MED ORDER — KCL IN DEXTROSE-NACL 20-5-0.45 MEQ/L-%-% IV SOLN
INTRAVENOUS | Status: DC
  Administered 2010-10-12 – 2010-10-13 (×3): via INTRAVENOUS
  Filled 2010-10-12 (×5): qty 1000

## 2010-10-12 MED ORDER — MORPHINE SULFATE 0.5 MG/ML IJ SOLN
INTRAMUSCULAR | Status: AC
  Filled 2010-10-12: qty 10

## 2010-10-12 MED ORDER — MORPHINE SULFATE 4 MG/ML IJ SOLN
4.0000 mg | Freq: Once | INTRAMUSCULAR | Status: AC
  Administered 2010-10-12: 4 mg via INTRAVENOUS
  Filled 2010-10-12: qty 1

## 2010-10-12 MED ORDER — ONDANSETRON HCL 4 MG/2ML IJ SOLN
INTRAMUSCULAR | Status: AC
  Filled 2010-10-12: qty 2

## 2010-10-12 MED ORDER — CITRIC ACID-SODIUM CITRATE 334-500 MG/5ML PO SOLN
ORAL | Status: AC
  Filled 2010-10-12: qty 15

## 2010-10-12 MED ORDER — OXYTOCIN 10 UNIT/ML IJ SOLN
INTRAMUSCULAR | Status: DC | PRN
  Administered 2010-10-12 (×2): 20 [IU] via INTRAMUSCULAR

## 2010-10-12 MED ORDER — IBUPROFEN 600 MG PO TABS
600.0000 mg | ORAL_TABLET | Freq: Four times a day (QID) | ORAL | Status: DC
  Administered 2010-10-13 – 2010-10-15 (×10): 600 mg via ORAL
  Filled 2010-10-12 (×4): qty 1

## 2010-10-12 MED ORDER — FENTANYL CITRATE 0.05 MG/ML IJ SOLN
25.0000 ug | INTRAMUSCULAR | Status: DC | PRN
  Administered 2010-10-12 (×2): 50 ug via INTRAVENOUS

## 2010-10-12 MED ORDER — FENTANYL CITRATE 0.05 MG/ML IJ SOLN
INTRAMUSCULAR | Status: AC
  Filled 2010-10-12: qty 2

## 2010-10-12 MED ORDER — KETOROLAC TROMETHAMINE 60 MG/2ML IM SOLN
INTRAMUSCULAR | Status: AC
  Administered 2010-10-12: 60 mg via INTRAMUSCULAR
  Filled 2010-10-12: qty 2

## 2010-10-12 MED ORDER — HYDROMORPHONE HCL 1 MG/ML IJ SOLN
0.2500 mg | INTRAMUSCULAR | Status: DC | PRN
  Administered 2010-10-12: 0.5 mg via INTRAVENOUS

## 2010-10-12 MED ORDER — SODIUM CHLORIDE 0.9 % IV SOLN
Freq: Once | INTRAVENOUS | Status: AC
  Administered 2010-10-12: 14:00:00 via INTRAVENOUS
  Filled 2010-10-12: qty 1000

## 2010-10-12 MED ORDER — FENTANYL CITRATE 0.05 MG/ML IJ SOLN
INTRAMUSCULAR | Status: DC | PRN
  Administered 2010-10-12: 15 ug via INTRATHECAL
  Administered 2010-10-12: 85 ug via INTRAVENOUS

## 2010-10-12 MED ORDER — OXYTOCIN 20 UNITS IN LACTATED RINGERS INFUSION - SIMPLE
75.0000 mL/h | INTRAVENOUS | Status: DC
  Filled 2010-10-12: qty 1000

## 2010-10-12 MED ORDER — MORPHINE SULFATE 10 MG/ML IJ SOLN
INTRAMUSCULAR | Status: DC | PRN
  Administered 2010-10-12: 10 mg via INTRAVENOUS

## 2010-10-12 MED ORDER — MAGNESIUM SULFATE 50 % IJ SOLN
5.0000 g | Freq: Once | INTRAVENOUS | Status: DC
  Filled 2010-10-12: qty 10

## 2010-10-12 MED ORDER — HYDROMORPHONE HCL 1 MG/ML IJ SOLN
INTRAMUSCULAR | Status: AC
  Administered 2010-10-12: 0.5 mg via INTRAVENOUS
  Filled 2010-10-12: qty 1

## 2010-10-12 MED ORDER — MORPHINE SULFATE (PF) 0.5 MG/ML IJ SOLN
INTRAMUSCULAR | Status: DC | PRN
  Administered 2010-10-12: .1 mg via INTRATHECAL

## 2010-10-12 MED ORDER — FENTANYL CITRATE 0.05 MG/ML IJ SOLN
INTRAMUSCULAR | Status: AC
  Administered 2010-10-12: 50 ug via INTRAVENOUS
  Filled 2010-10-12: qty 2

## 2010-10-12 MED ORDER — PRENATAL PLUS 27-1 MG PO TABS
1.0000 | ORAL_TABLET | Freq: Every day | ORAL | Status: DC
  Administered 2010-10-13 – 2010-10-15 (×3): 1 via ORAL
  Filled 2010-10-12 (×3): qty 1

## 2010-10-12 MED ORDER — ZOLPIDEM TARTRATE 5 MG PO TABS: 5.0000 mg | ORAL_TABLET | Freq: Every evening | ORAL | Status: DC | PRN

## 2010-10-12 MED ORDER — MORPHINE SULFATE 10 MG/ML IJ SOLN
INTRAMUSCULAR | Status: AC
  Filled 2010-10-12: qty 1

## 2010-10-12 MED ORDER — MAGNESIUM SULFATE BOLUS VIA INFUSION
5.0000 g | Freq: Once | INTRAVENOUS | Status: AC
  Administered 2010-10-12: 5 g via INTRAVENOUS
  Filled 2010-10-12: qty 500

## 2010-10-12 MED ORDER — MAGNESIUM SULFATE 40 G IN LACTATED RINGERS - SIMPLE
2.0000 g/h | INTRAVENOUS | Status: AC
  Administered 2010-10-12 – 2010-10-13 (×3): 2 g/h via INTRAVENOUS
  Filled 2010-10-12 (×2): qty 500

## 2010-10-12 MED ORDER — OXYCODONE-ACETAMINOPHEN 5-325 MG PO TABS
1.0000 | ORAL_TABLET | ORAL | Status: DC | PRN
  Administered 2010-10-13 (×3): 1 via ORAL
  Administered 2010-10-13: 2 via ORAL
  Administered 2010-10-13: 1 via ORAL
  Administered 2010-10-14 (×4): 2 via ORAL
  Administered 2010-10-15: 1 via ORAL
  Administered 2010-10-15: 2 via ORAL
  Filled 2010-10-12 (×6): qty 1
  Filled 2010-10-12: qty 2
  Filled 2010-10-12: qty 1
  Filled 2010-10-12 (×4): qty 2

## 2010-10-12 MED ORDER — ONDANSETRON HCL 4 MG/2ML IJ SOLN: 4.0000 mg | INTRAMUSCULAR | Status: DC | PRN

## 2010-10-12 MED ORDER — CITRIC ACID-SODIUM CITRATE 334-500 MG/5ML PO SOLN
30.0000 mL | Freq: Once | ORAL | Status: DC
  Filled 2010-10-12: qty 30

## 2010-10-12 MED ORDER — GI COCKTAIL ~~LOC~~
30.0000 mL | Freq: Once | ORAL | Status: AC
  Administered 2010-10-12: 30 mL via ORAL
  Filled 2010-10-12: qty 30

## 2010-10-12 SURGICAL SUPPLY — 31 items
CLOTH BEACON ORANGE TIMEOUT ST (SAFETY) ×3 IMPLANT
DERMABOND ADVANCED (GAUZE/BANDAGES/DRESSINGS) ×3 IMPLANT
DRAPE UTILITY XL STRL (DRAPES) ×3 IMPLANT
DURAPREP 26ML APPLICATOR (WOUND CARE) ×6 IMPLANT
ELECT REM PT RETURN 9FT ADLT (ELECTROSURGICAL) ×3
ELECTRODE REM PT RTRN 9FT ADLT (ELECTROSURGICAL) ×2 IMPLANT
EXTRACTOR VACUUM BELL STYLE (SUCTIONS) ×3 IMPLANT
GLOVE BIOGEL PI IND STRL 8 (GLOVE) ×4 IMPLANT
GLOVE BIOGEL PI INDICATOR 8 (GLOVE) ×2
GLOVE ECLIPSE 8.0 STRL XLNG CF (GLOVE) ×3 IMPLANT
GOWN STRL REIN XL XLG (GOWN DISPOSABLE) ×9 IMPLANT
KIT ABG SYR 3ML LUER SLIP (SYRINGE) ×3 IMPLANT
NEEDLE HYPO 25X5/8 SAFETYGLIDE (NEEDLE) ×3 IMPLANT
NS IRRIG 1000ML POUR BTL (IV SOLUTION) ×3 IMPLANT
PACK C SECTION WH (CUSTOM PROCEDURE TRAY) ×3 IMPLANT
RTRCTR C-SECT PINK 25CM LRG (MISCELLANEOUS) IMPLANT
SLEEVE SCD COMPRESS KNEE LRG (MISCELLANEOUS) ×3 IMPLANT
SLEEVE SCD COMPRESS KNEE MED (MISCELLANEOUS) IMPLANT
STAPLER VISISTAT 35W (STAPLE) IMPLANT
SUT CHROMIC 0 CT 1 (SUTURE) ×3 IMPLANT
SUT MNCRL 0 VIOLET CTX 36 (SUTURE) ×6 IMPLANT
SUT MONOCRYL 0 CTX 36 (SUTURE) ×3
SUT PLAIN 2 0 (SUTURE)
SUT PLAIN 2 0 XLH (SUTURE) ×3 IMPLANT
SUT PLAIN ABS 2-0 CT1 27XMFL (SUTURE) IMPLANT
SUT VIC AB 0 CTX 36 (SUTURE) ×1
SUT VIC AB 0 CTX36XBRD ANBCTRL (SUTURE) ×2 IMPLANT
SUT VIC AB 4-0 KS 27 (SUTURE) ×3 IMPLANT
TOWEL OR 17X24 6PK STRL BLUE (TOWEL DISPOSABLE) ×6 IMPLANT
TRAY FOLEY CATH 14FR (SET/KITS/TRAYS/PACK) ×3 IMPLANT
WATER STERILE IRR 1000ML POUR (IV SOLUTION) IMPLANT

## 2010-10-12 SURGICAL SUPPLY — 29 items
CLOTH BEACON ORANGE TIMEOUT ST (SAFETY) IMPLANT
DERMABOND ADVANCED (GAUZE/BANDAGES/DRESSINGS) IMPLANT
DURAPREP 26ML APPLICATOR (WOUND CARE) IMPLANT
ELECT REM PT RETURN 9FT ADLT (ELECTROSURGICAL)
ELECTRODE REM PT RTRN 9FT ADLT (ELECTROSURGICAL) IMPLANT
EXTRACTOR VACUUM BELL STYLE (SUCTIONS) IMPLANT
GLOVE BIOGEL PI IND STRL 8 (GLOVE) IMPLANT
GLOVE BIOGEL PI INDICATOR 8 (GLOVE)
GLOVE ECLIPSE 8.0 STRL XLNG CF (GLOVE) IMPLANT
GOWN STRL REIN XL XLG (GOWN DISPOSABLE) IMPLANT
KIT ABG SYR 3ML LUER SLIP (SYRINGE) IMPLANT
NEEDLE HYPO 25X5/8 SAFETYGLIDE (NEEDLE) IMPLANT
NS IRRIG 1000ML POUR BTL (IV SOLUTION) IMPLANT
PACK C SECTION WH (CUSTOM PROCEDURE TRAY) IMPLANT
RTRCTR C-SECT PINK 25CM LRG (MISCELLANEOUS) IMPLANT
SLEEVE SCD COMPRESS KNEE MED (MISCELLANEOUS) IMPLANT
STAPLER VISISTAT 35W (STAPLE) IMPLANT
SUT CHROMIC 0 CT 1 (SUTURE) IMPLANT
SUT MNCRL 0 VIOLET CTX 36 (SUTURE) IMPLANT
SUT MONOCRYL 0 CTX 36 (SUTURE)
SUT PLAIN 2 0 (SUTURE)
SUT PLAIN 2 0 XLH (SUTURE) IMPLANT
SUT PLAIN ABS 2-0 CT1 27XMFL (SUTURE) IMPLANT
SUT VIC AB 0 CTX 36 (SUTURE)
SUT VIC AB 0 CTX36XBRD ANBCTRL (SUTURE) IMPLANT
SUT VIC AB 4-0 KS 27 (SUTURE) IMPLANT
TOWEL OR 17X24 6PK STRL BLUE (TOWEL DISPOSABLE) IMPLANT
TRAY FOLEY CATH 14FR (SET/KITS/TRAYS/PACK) IMPLANT
WATER STERILE IRR 1000ML POUR (IV SOLUTION) IMPLANT

## 2010-10-12 NOTE — Consult Note (Signed)
Delivery Note   10/12/2010  6:47 PM  Requested by Dr. Despina Hidden  to attend this repeat C-section for preecclampsia at 37 1/[redacted] weeks gestation.  Born to a 25 y/o G3P2 mother with [facility]  and negative screens.          Prenatal problems included  Preecclampsia, GDM diet controlled and anemia.    AROM at delivery with clear fluid. The c/section delivery was assisted by multiple vacuum-assisted attempts. Infant handed to Neo limp but crying.  Vigorously stimulated,bulb suctioned and kept warm.  Infant picked up spontaneously with APGAR 7 and 9.  Extra digit on left hand noted at delivery.  Care transfer to Dr. Donnie Coffin.     Brianna Abrahams V.T. Ameliarose Shark, MD Neonatologist

## 2010-10-12 NOTE — Anesthesia Postprocedure Evaluation (Signed)
  Anesthesia Post-op Note  Patient: Brianna Reeves  Procedure(s) Performed:  CESAREAN SECTION - Repeat Cesarean Section with Delivery Baby Boy @ 1756; Apgars 7/9  No anesthesia complications.  Level of consciousness: alert. Cardiopulmonary status stable.  No follow-up care or observation required.  Aadhira Heffernan L. Rodman Pickle, MD

## 2010-10-12 NOTE — Transfer of Care (Signed)
Immediate Anesthesia Transfer of Care Note  Patient: Brianna Reeves  Procedure(s) Performed:  CESAREAN SECTION - Repeat Cesarean Section with Delivery Baby Boy @ 1756; Apgars 7/9  Patient Location: PACU  Anesthesia Type: Spinal  Level of Consciousness: awake, alert  and oriented  Airway & Oxygen Therapy: Patient Spontanous Breathing  Post-op Assessment: Report given to PACU RN and Post -op Vital signs reviewed and stable  Post vital signs: Reviewed and stable  Complications: No apparent anesthesia complications

## 2010-10-12 NOTE — Anesthesia Procedure Notes (Addendum)
Spinal Block  Patient location during procedure: OR Start time: 10/12/2010 6:27 PM Staffing Anesthesiologist: Edlyn Rosenburg L. Performed by: anesthesiologist  Preanesthetic Checklist Completed: patient identified, site marked, surgical consent, pre-op evaluation, timeout performed, IV checked, risks and benefits discussed and monitors and equipment checked Spinal Block Patient position: sitting Prep: site prepped and draped and DuraPrep Patient monitoring: cardiac monitor, continuous pulse ox, blood pressure and heart rate Approach: midline Location: L3-4 Injection technique: catheter Needle Needle type: Tuohy and Pencan  Needle gauge: 25 G Needle length: 12.7 cm Needle insertion depth: 7 cm Catheter type: closed end flexible Catheter size: 19 g Catheter at skin depth: 12 cm Additional Notes Very difficult placement (~25 mins).  Unsuccessful at L3-4 with 24 ga Sprotte and Pencan spinal needles via introducer and with 17 ga tuohy and 25 ga Pencan CSE kit.  Next attempts at L4-5 with CSE kit were finally successful with LOR to air at 7 cm.  +CSF return from [place] via [place] - spinal dose given.  Epidural cath threaded after Pencan removed and secured at 12 cm at skin.  Pt to supine with LUD.

## 2010-10-12 NOTE — Anesthesia Preprocedure Evaluation (Addendum)
Anesthesia Evaluation  Name, MR# and DOB Patient awake  General Assessment Comment  Reviewed: Allergy & Precautions, H&P , Patient's Chart, lab work & pertinent test results and reviewed documented beta blocker date and time   History of Anesthesia Complications Negative for: history of anesthetic complications  Airway Mallampati: II TM Distance: >3 FB Neck ROM: full    Dental  (+) Teeth Intact   Pulmonaryneg pulmonary ROS    clear to auscultation    Cardiovascular hypertension (ran out of her meds, preeclampsia), On Medications regular Normal   Neuro/PsychLower back pain, worse with pregnancy Tingling in bilateral hands Negative Neurological ROS Negative Psych ROS  GI/Hepatic/Renal negative GI ROS, negative Liver ROS, and negative Renal ROS (+)       Endo/Other   (+) Diabetes mellitus- (diet controlled), Gestational Morbid obesity Abdominal   Musculoskeletal  Hematology negative hematology ROS (+)   Peds  Reproductive/Obstetrics (+) Pregnancy   Anesthesia Other Findings             Anesthesia Physical Anesthesia Plan  ASA: III  Anesthesia Plan: Spinal   Post-op Pain Management:    Induction:   Airway Management Planned:   Additional Equipment:   Intra-op Plan:   Post-operative Plan:   Informed Consent: I have reviewed the patients History and Physical, chart, labs and discussed the procedure including the risks, benefits and alternatives for the proposed anesthesia with the patient or authorized representative who has indicated his/her understanding and acceptance.     Plan Discussed with: CRNA and Surgeon  Anesthesia Plan Comments:         Anesthesia Quick Evaluation

## 2010-10-12 NOTE — H&P (Signed)
Brianna Reeves is a 25 y.o. female, G3P1102 at 37.[redacted] wks EGA who presented to MAU for chest pain, leg swelling, and abdominal pain: see MAU provider note for 7/27.   Maternal Medical History:  Reason for admission: Reason for admission: contractions.  Reason for Admission:   nauseaContractions: Onset was yesterday.   Frequency: irregular.    Fetal activity: Perceived fetal activity is normal.   Last perceived fetal movement was within the past hour.    Prenatal complications: Hypertension.   No bleeding, HIV, IUGR, pre-eclampsia or preterm labor.   Prenatal Complications - Diabetes: gestational. Diabetes is managed by diet.      OB History    Grav Para Term Preterm Abortions TAB SAB Ect Mult Living   3 2 1 1      2      Past Medical History  Diagnosis Date  . Anemia   . Potassium (K) deficiency   . Gestational diabetes   . Gestational age 30-36 weeks    Past Surgical History  Procedure Date  . Cesarean section   . Tonsillectomy   . Cholecystectomy    Family History: family history is not on file. Social History:  reports that she has quit smoking. She does not have any smokeless tobacco history on file. She reports that she does not drink alcohol or use illicit drugs.  Review of Systems  Constitutional: Negative.   HENT: Negative.   Eyes: Negative.   Respiratory: Negative.   Cardiovascular: Positive for chest pain.  Gastrointestinal: Positive for abdominal pain and constipation. Negative for heartburn, nausea and vomiting.  Genitourinary: Negative.   Musculoskeletal: Negative.   Skin: Negative.   Neurological: Negative.   Endo/Heme/Allergies: Negative.   Psychiatric/Behavioral: Negative.     Dilation: Fingertip Station: +3 Exam by:: Dr Natale Milch Blood pressure 128/94, pulse 94, temperature 98.3 F (36.8 C), temperature source Oral, resp. rate 18, height 5\' 6"  (1.676 m), weight 245 lb (111.131 kg), SpO2 98.00%. Maternal Exam:  Uterine Assessment: Contraction  strength is mild.  Contraction frequency is regular.   Abdomen: Patient reports the following abdominal tenderness: suprapubic.  Surgical scars: low transverse.   Estimated fetal weight is 8 lbs.   Fetal presentation: vertex  Introitus: Normal vulva. Normal vagina.  Ferning test: not done.   Cervix: Cervix evaluated by digital exam.     Physical Exam  Constitutional: She is oriented to person, place, and time. She appears well-developed and well-nourished. She appears distressed.  HENT:  Head: Normocephalic.  Eyes: Pupils are equal, round, and reactive to light.  Neck: Normal range of motion.  Cardiovascular: Normal rate and regular rhythm.  Exam reveals friction rub. Exam reveals no gallop.   Murmur heard. Respiratory: Effort normal and breath sounds normal. No respiratory distress. She has no wheezes. She has no rales. She exhibits no tenderness.  GI: Soft. Bowel sounds are normal. There is tenderness in the suprapubic area.  Musculoskeletal: Normal range of motion.  Neurological: She is alert and oriented to person, place, and time. She has normal reflexes. She displays normal reflexes. No cranial nerve deficit.  Skin: Skin is warm and dry. No rash noted. She is not diaphoretic.  Psychiatric: She has a normal mood and affect.  Dilation: Fingertip Cervical Position: Posterior Station: +3 Presentation: Vertex Exam by:: Dr Natale Milch    Prenatal labs: ABO, Rh: O POS (01/18 1448) Antibody:  neg Rubella:  immune RPR: Nonreactive (04/12 0000)  HBsAg: Negative (04/12 0000)  HIV: Non-reactive (04/12 0000)  GBS: NEGATIVE (  07/19 1314) POSITIVE IN URINE Gc/Ch: neg/neg 1 hr GTT: 133  Results for orders placed during the hospital encounter of 10/12/10 (from the past 24 hour(s))  COMPREHENSIVE METABOLIC PANEL     Status: Abnormal   Collection Time   10/12/10 12:10 PM      Component Value Range   Sodium 138  135 - 145 (mEq/L)   Potassium 2.6 (*) 3.5 - 5.1 (mEq/L)   Chloride 101   96 - 112 (mEq/L)   CO2 25  19 - 32 (mEq/L)   Glucose, Bld 132 (*) 70 - 99 (mg/dL)   BUN <3 (*) 6 - 23 (mg/dL)   Creatinine, Ser 3.29  0.50 - 1.10 (mg/dL)   Calcium 9.3  8.4 - 51.8 (mg/dL)   Total Protein 6.9  6.0 - 8.3 (g/dL)   Albumin 2.2 (*) 3.5 - 5.2 (g/dL)   AST 21  0 - 37 (U/L)   ALT 9  0 - 35 (U/L)   Alkaline Phosphatase 255 (*) 39 - 117 (U/L)   Total Bilirubin 0.5  0.3 - 1.2 (mg/dL)   GFR calc non Af Amer >60  >60 (mL/min)   GFR calc Af Amer >60  >60 (mL/min)  CBC     Status: Abnormal   Collection Time   10/12/10 12:10 PM      Component Value Range   WBC 6.7  4.0 - 10.5 (K/uL)   RBC 4.05  3.87 - 5.11 (MIL/uL)   Hemoglobin 7.9 (*) 12.0 - 15.0 (g/dL)   HCT 84.1 (*) 66.0 - 46.0 (%)   MCV 67.9 (*) 78.0 - 100.0 (fL)   MCH 19.5 (*) 26.0 - 34.0 (pg)   MCHC 28.7 (*) 30.0 - 36.0 (g/dL)   RDW 63.0 (*) 16.0 - 15.5 (%)   Platelets 194  150 - 400 (K/uL)  URINALYSIS, ROUTINE W REFLEX MICROSCOPIC     Status: Abnormal   Collection Time   10/12/10 12:57 PM      Component Value Range   Color, Urine YELLOW  YELLOW    Appearance HAZY (*) CLEAR    Specific Gravity, Urine 1.010  1.005 - 1.030    pH 7.0  5.0 - 8.0    Glucose, UA NEGATIVE  NEGATIVE (mg/dL)   Hgb urine dipstick NEGATIVE  NEGATIVE    Bilirubin Urine NEGATIVE  NEGATIVE    Ketones, ur NEGATIVE  NEGATIVE (mg/dL)   Protein, ur NEGATIVE  NEGATIVE (mg/dL)   Urobilinogen, UA 4.0 (*) 0.0 - 1.0 (mg/dL)   Nitrite NEGATIVE  NEGATIVE    Leukocytes, UA LARGE (*) NEGATIVE   URINE MICROSCOPIC-ADD ON     Status: Abnormal   Collection Time   10/12/10 12:57 PM      Component Value Range   Squamous Epithelial / LPF RARE  RARE    WBC, UA 7-10  <3 (WBC/hpf)   RBC / HPF 0-2  <3 (RBC/hpf)   Bacteria, UA FEW (*) RARE    Urine-Other FEW YEAST    PROTEIN / CREATININE RATIO, URINE     Status: Abnormal   Collection Time   10/12/10 12:58 PM      Component Value Range   Creatinine, Urine 115.08     Total Protein, Urine 36.6     PROTEIN  CREATININE RATIO 0.32 (*) 0.00 - 0.15       Assessment/Plan: 1. Admit for repeat c/s and BTL. Plans discussed with patient. 2. Significant anemia (Hgb 7.9); pt will be monitored for need for blood transfusion. 3.  Urine protein/creatine ratio 0.32: she will be on MgSO4 after delivery for PreE. 4. HTN: will give labetalol for elevated blood pressures noted in MAU.    Anjelo Pullman N 10/12/2010, 3:52 PM

## 2010-10-12 NOTE — Op Note (Signed)
Preoperative diagnosis:  1.  Intrauterine pregnancy at 37 6/[redacted] weeks gestation                                         2.  previous cesarean section                                         3.  declines trial of labor                                         4.  Early Labor                                         5.  Mild pre-eclampsia  Postoperative diagnosis:  Same + adhesions to the anterior abdominal wall  Procedure:  Repeat cesarean section, failed attempt at bilateral tubal ligation  Surgeon:  Lazaro Arms MD  Assistant:  Holbrook DO, E. Alhfield, PA-S  Anesthesia: Spinal  Findings:  Over a low transverse incision was delivered a viable female with Apgars of 7and 9 with a weight of 7 lbs. 9 oz at 1857.  There were dense adhesions to the anterior abdominal wall and uterus.  We were unable to do a BTL because it was so dense.  It will have to be performed l;aparoscopically.  Description of operation:  Patient was taken to the operating room and placed in the sitting position where she underwent a spinal anesthetic. She was then placed in the supine position with tilt to the left side. When adequate anesthetic level was obtained she was prepped and draped in usual sterile fashion and a Foley catheter was placed. A Pfannenstiel skin incision was made and carried down sharply to the rectus fascia which was scored in the midline extended laterally. The fascia was taken off the muscles both superiorly and with great difficulty. The muscles were divided.  The peritoneal cavity was entered. The bladder flap was created because of a high riding bladder. Bladder blade was placed.  A low transverse hysterotomy incision was made over this incision was delivered a viable female infant at 53 with Apgars of 7 and 9 with weight 7 lbs 7 oz.  Cord pH was obtained and 7.29. The infant underwent routine neonatal resuscitation. The uterus could not be exteriorized. It was closed in 2 layers the first being a  running interlocking layer and the second being an imbricating layer. There was good hemostasis the  tubes and ovaries were not visualized. Peritoneal cavity was irrigated vigorously. The muscles and peritoneum were reapproximated loosely. The fascia was closed using 0 Vicryl in running fashion. Subcutaneous stitches made hemostatic and irrigated the skin was closed using 4-0 Vicryl on a Keith needle in a subcuticular fashion and Dermabond. Blood loss for the procedure was 800 cc. The patient received a gram of Ancef prophylactically. The patient was taken to the recovery room in good stable condition with all counts being correct x3.

## 2010-10-13 LAB — COMPREHENSIVE METABOLIC PANEL
AST: 20 U/L (ref 0–37)
Albumin: 2 g/dL — ABNORMAL LOW (ref 3.5–5.2)
Chloride: 101 mEq/L (ref 96–112)
Creatinine, Ser: 0.66 mg/dL (ref 0.50–1.10)
Potassium: 3 mEq/L — ABNORMAL LOW (ref 3.5–5.1)
Total Bilirubin: 0.5 mg/dL (ref 0.3–1.2)

## 2010-10-13 LAB — CBC
MCV: 68.5 fL — ABNORMAL LOW (ref 78.0–100.0)
Platelets: 208 10*3/uL (ref 150–400)
RDW: 17.8 % — ABNORMAL HIGH (ref 11.5–15.5)
WBC: 10.6 10*3/uL — ABNORMAL HIGH (ref 4.0–10.5)

## 2010-10-13 LAB — MRSA PCR SCREENING: MRSA by PCR: NEGATIVE

## 2010-10-13 MED ORDER — ONDANSETRON HCL 4 MG/2ML IJ SOLN: 4.0000 mg | Freq: Three times a day (TID) | INTRAMUSCULAR | Status: DC | PRN

## 2010-10-13 MED ORDER — IBUPROFEN 600 MG PO TABS
600.0000 mg | ORAL_TABLET | Freq: Four times a day (QID) | ORAL | Status: DC | PRN
  Filled 2010-10-13 (×6): qty 1

## 2010-10-13 MED ORDER — NALBUPHINE HCL 10 MG/ML IJ SOLN
5.0000 mg | INTRAMUSCULAR | Status: AC | PRN
  Filled 2010-10-13: qty 1

## 2010-10-13 MED ORDER — KETOROLAC TROMETHAMINE 30 MG/ML IJ SOLN: 30.0000 mg | Freq: Four times a day (QID) | INTRAMUSCULAR | Status: AC | PRN

## 2010-10-13 MED ORDER — SODIUM CHLORIDE 0.9 % IJ SOLN: 3.0000 mL | INTRAMUSCULAR | Status: DC | PRN

## 2010-10-13 MED ORDER — MEPERIDINE HCL 25 MG/ML IJ SOLN: 6.2500 mg | INTRAMUSCULAR | Status: DC | PRN

## 2010-10-13 MED ORDER — SODIUM CHLORIDE 0.9 % IV SOLN
1.0000 ug/kg/h | INTRAVENOUS | Status: DC | PRN
  Filled 2010-10-13: qty 2.5

## 2010-10-13 MED ORDER — DIPHENHYDRAMINE HCL 50 MG/ML IJ SOLN: 12.5000 mg | INTRAMUSCULAR | Status: DC | PRN

## 2010-10-13 MED ORDER — NALOXONE HCL 0.4 MG/ML IJ SOLN: 0.4000 mg | INTRAMUSCULAR | Status: DC | PRN

## 2010-10-13 MED ORDER — METOCLOPRAMIDE HCL 5 MG/ML IJ SOLN: 10.0000 mg | Freq: Three times a day (TID) | INTRAMUSCULAR | Status: DC | PRN

## 2010-10-13 MED ORDER — DIPHENHYDRAMINE HCL 25 MG PO CAPS: 25.0000 mg | ORAL_CAPSULE | ORAL | Status: DC | PRN

## 2010-10-13 MED ORDER — DIPHENHYDRAMINE HCL 50 MG/ML IJ SOLN: 25.0000 mg | INTRAMUSCULAR | Status: DC | PRN

## 2010-10-13 NOTE — Progress Notes (Signed)
Mother pumped for a few weeks with last child., Has not attempted to breast feed or pump, staff have offered assistance and mother has declined. Sat up DEBP with #24 flanges and showed mother how to use electric pump. Mother  Declined to pump at this time. Infant was taken to nursery 30 mins ago and was fed just fed. Encouraged to pump every 3 hrs.

## 2010-10-13 NOTE — Progress Notes (Deleted)
Pt had a bout of emesis; huge clot passed (150cc) while vomiting.  Dr. Despina Hidden notified.  Came to the bedside at 2:45 to assess pt.  Gave orders for methergine and CBC.  Will continue to monitor pt closely.

## 2010-10-13 NOTE — Progress Notes (Signed)
Hospital Days:  1  Subjective: Postpartum Day 1: Cesarean Delivery Patient reports no complaints except normal amount of post op pain.  No nausea.  Tolerating liquids.  Objective: Vital signs in last 24 hours: Temp:  [98.3 F (36.8 C)-98.8 F (37.1 C)] 98.4 F (36.9 C) (07/28 0400) Pulse Rate:  [80-96] 93  (07/28 0700) Resp:  [16-26] 20  (07/28 0700) BP: (128-167)/(69-96) 144/85 mmHg (07/28 0700) SpO2:  [92 %-100 %] 98 % (07/28 0700) Weight:  [108.546 kg (239 lb 4.8 oz)-111.131 kg (245 lb)] 239 lb 4.8 oz (108.546 kg) (07/28 0600)  I/O last 3 completed shifts: In: 5550 [P.O.:2160; I.V.:3390] Out: 1610 [RUEAV:4098; Blood:800]    Physical Exam:  General: alert, cooperative and no distress Lochia: appropriate Uterine Fundus: firm Incision: healing well, no significant drainage DVT Evaluation: No evidence of DVT seen on physical exam. Negative Homan's sign.   Basename 10/13/10 0529 10/12/10 2037  HGB 7.7* 7.4*  HCT 26.7* 26.0*    Assessment/Plan: Status post Cesarean section. Doing well postoperatively.  Continue current care.  Mild pre-eclampsia:  Cont Mg prophylaxis for 24 hours.  Anemia:  Recheck in am  Kacey Vicuna H 10/13/2010, 8:02 AM

## 2010-10-14 LAB — COMPREHENSIVE METABOLIC PANEL
BUN: 5 mg/dL — ABNORMAL LOW (ref 6–23)
CO2: 29 mEq/L (ref 19–32)
Calcium: 9 mg/dL (ref 8.4–10.5)
Chloride: 103 mEq/L (ref 96–112)
Creatinine, Ser: 0.69 mg/dL (ref 0.50–1.10)
GFR calc non Af Amer: >60 mL/min (ref >60)
Total Bilirubin: 0.4 mg/dL (ref 0.3–1.2)

## 2010-10-14 LAB — RPR: RPR Ser Ql: NONREACTIVE

## 2010-10-14 LAB — HEMOGLOBIN AND HEMATOCRIT, BLOOD: Hemoglobin: 6.9 g/dL — CL (ref 12.0–15.0)

## 2010-10-14 MED ORDER — CEFAZOLIN SODIUM 1-5 GM-% IV SOLN
1.0000 g | INTRAVENOUS | Status: DC
  Filled 2010-10-14: qty 50

## 2010-10-14 MED ORDER — TETANUS-DIPHTH-ACELL PERTUSSIS 5-2.5-18.5 LF-MCG/0.5 IM SUSP
0.5000 mL | Freq: Once | INTRAMUSCULAR | Status: DC
  Filled 2010-10-14: qty 0.5

## 2010-10-14 MED ORDER — FUROSEMIDE 20 MG PO TABS
20.0000 mg | ORAL_TABLET | Freq: Every day | ORAL | Status: AC
  Administered 2010-10-14: 20 mg via ORAL
  Filled 2010-10-14: qty 1

## 2010-10-14 NOTE — Progress Notes (Signed)
  Hospital Days:  2  Subjective: Postpartum Day 2: Cesarean Delivery Patient reports no complaints except normal amount of post op pain.  No nausea.  Eating regular food.  Objective: Vital signs in last 24 hours: Temp:  [98.3 F (36.8 C)-98.8 F (37.1 C)] 98.6 F (37 C) (07/29 0520) Pulse Rate:  [77-104] 87  (07/29 0520) Resp:  [16-20] 18  (07/29 0520) BP: (131-156)/(71-92) 156/85 mmHg (07/29 0520) SpO2:  [98 %-100 %] 99 % (07/29 0520) Weight:  [108.636 kg (239 lb 8 oz)] 239 lb 8 oz (108.636 kg) (07/29 0500)  I/O last 3 completed shifts: In: 16109 [P.O.:7600; I.V.:4315] Out: 60454 [Urine:17275; Blood:800]    Physical Exam:  General: alert, cooperative and no distress Lochia: appropriate Uterine Fundus: firm Incision: healing well, no significant drainage DVT Evaluation: No evidence of DVT seen on physical exam. Negative Homan's sign.   Basename 10/14/10 0507 10/13/10 0529  HGB 6.9* 7.7*  HCT 23.6* 26.7*    Assessment/Plan: Status post Cesarean section. Doing well postoperatively.  Continue current care.  Magnesium off, transfer to floor  Anemia:  Stable, predictable drop, based on pre op value of 7.9  Breast/Bottle  Kekoa Fyock H 10/14/2010, 8:57 AM

## 2010-10-14 NOTE — Plan of Care (Signed)
Problem: Discharge Progression Outcomes Goal: Barriers To Progression Addressed/Resolved Magnesium gtt d/c'd on 7/28 @ ~ 2100

## 2010-10-14 NOTE — Progress Notes (Signed)
Critical hemoglobin of 6.9 reported to Dr. Orvan Falconer at 05:44. Pt asymptomatic.  No orders given.  Will continue to monitor closely.

## 2010-10-15 ENCOUNTER — Encounter (HOSPITAL_COMMUNITY): Payer: Self-pay | Admitting: *Deleted

## 2010-10-15 ENCOUNTER — Encounter (HOSPITAL_COMMUNITY): Payer: Self-pay | Admitting: Registered Nurse

## 2010-10-15 ENCOUNTER — Other Ambulatory Visit: Payer: Self-pay

## 2010-10-15 DIAGNOSIS — O1400 Mild to moderate pre-eclampsia, unspecified trimester: Secondary | ICD-10-CM

## 2010-10-15 DIAGNOSIS — E876 Hypokalemia: Secondary | ICD-10-CM

## 2010-10-15 DIAGNOSIS — O34219 Maternal care for unspecified type scar from previous cesarean delivery: Secondary | ICD-10-CM

## 2010-10-15 DIAGNOSIS — O24420 Gestational diabetes mellitus in childbirth, diet controlled: Secondary | ICD-10-CM

## 2010-10-15 LAB — HEMOGLOBIN AND HEMATOCRIT, BLOOD: Hemoglobin: 6.5 g/dL — CL (ref 12.0–15.0)

## 2010-10-15 MED ORDER — OXYCODONE-ACETAMINOPHEN 5-325 MG PO TABS: 1.0000 | ORAL_TABLET | ORAL | Status: AC | PRN

## 2010-10-15 MED ORDER — IBUPROFEN 600 MG PO TABS: 600.0000 mg | ORAL_TABLET | Freq: Four times a day (QID) | ORAL | Status: AC | PRN

## 2010-10-15 MED ORDER — HYDROCHLOROTHIAZIDE 12.5 MG PO CAPS: 25.0000 mg | ORAL_CAPSULE | Freq: Every day | ORAL | Status: DC

## 2010-10-15 MED ORDER — POTASSIUM CHLORIDE CRYS ER 20 MEQ PO TBCR: 20.0000 meq | EXTENDED_RELEASE_TABLET | Freq: Every day | ORAL | Status: DC

## 2010-10-15 NOTE — Progress Notes (Addendum)
Subjective: Postpartum Day 3: Cesarean Delivery Patient reports incisional pain, tolerating PO, + flatus and no problems voiding.   Wants BTL in 6weeks  Objective: Vital signs in last 24 hours: Temp:  [98.1 F (36.7 C)-99.2 F (37.3 C)] 98.9 F (37.2 C) (07/30 0531) Pulse Rate:  [102-118] 102  (07/30 0531) Resp:  [20-22] 20  (07/30 0531) BP: (123-150)/(81-90) 123/81 mmHg (07/30 0531) SpO2:  [96 %-98 %] 96 % (07/29 2200)  Physical Exam:  General: alert, cooperative, no distress and moderately obese Lochia: appropriate Uterine Fundus: firm Incision: healing well, no significant drainage, no dehiscence, no significant erythema DVT Evaluation: No evidence of DVT seen on physical exam. Negative Homan's sign. No cords or calf tenderness. Calf/Ankle edema is present. Breasts soft nontender  Basename 10/15/10 0540 10/14/10 0507  HGB 6.5* 6.9*  HCT 22.8* 23.6*    Assessment/Plan: A:  Status post Cesarean section. Postoperative course complicated by preeclampsia  GDM Hypokalemia (2.8 yesterday am)  P:  Has been on Kdur bid, will continue for a week while on HCTZ Not currently on Labetolol postop.  Will d/c home on HCTZ for a week to enhance diuresis. Will have pt sign BTL papers today 2hour glucose in 8 wks Discharge home with standard precautions and return to clinic in 4-6 weeks.  [facility] 10/15/2010, 9:23 AM

## 2010-10-15 NOTE — Progress Notes (Signed)
Encounter addended by: Cephus Shelling on: 10/15/2010 11:20 AM<BR>     Documentation filed: Notes Section

## 2010-10-15 NOTE — Anesthesia Postprocedure Evaluation (Signed)
  Anesthesia Post-op Note  Patient: Brianna Reeves  Procedure(s) Performed:  CESAREAN SECTION WITH BILATERAL TUBAL LIGATION - Repeat   Patient Location 117  Anesthesia Type: Regional  Level of Consciousness: awake  Airway and Oxygen Therapy: Patient Spontanous Breathing  Post-op Pain: mild  Post-op Assessment: Post-op Vital signs reviewed  Post-op Vital Signs: Reviewed  Complications: discomfort at needle insertion site

## 2010-10-15 NOTE — Anesthesia Postprocedure Evaluation (Signed)
  Anesthesia Post-op Note  Patient: Brianna Reeves  Procedure(s) Performed:  CESAREAN SECTION - Repeat Cesarean Section with Delivery Baby Boy @ 1756; Apgars 7/9  Patient Location: PACU  Anesthesia Type: Epidural  Level of Consciousness: awake, alert  and oriented  Airway and Oxygen Therapy: Patient Spontanous Breathing  Post-op Assessment: Post-op Vital signs reviewed and Patient's Cardiovascular Status Stable  Post-op Vital Signs: Reviewed and stable  Complications: No apparent anesthesia complications

## 2010-10-15 NOTE — Progress Notes (Signed)
UR chart review completed.  

## 2010-10-15 NOTE — Discharge Summary (Signed)
Agree with above note.  Brianna Reeves 10/15/2010 3:25 PM

## 2010-10-15 NOTE — Discharge Summary (Signed)
Obstetric Discharge Summary Reason for Admission: cesarean section and c/o chest pain, leg swelling, and contractions with history of previous C/S Prenatal Procedures: NST Intrapartum Procedures: cesarean: low cervical, transverse, Spinal Anesthesia Postpartum Procedures: none Complications-Operative and Postpartum: hypokalemia, incidental, anemia,  and dense adhesions.  Hospital Course:  Presented with above complaints. Elevated BPs were discovered 128/94 with early labor.  In light of previous C/S, and patient's declination of trial of labor, she was taken for a repeat C/S under Spinal anesthesia. She was delivered by Dr Despina Hidden for a viable female infant, Apgars 7/9, Weight 7+9, cord pH 7.29. Dense adhesions were encountered during the surgery, preventing BTL from being accomplished.   She was taken to Recovery and then to AICU where her Magnesium Sulfate infusion was continued for 24 hours.  On Post Op Day #1, her BPs ranged 128-167 / 65-96.  Her dressing was removed, as were her foley and IVs later removed after the Magnesium Sulfate was discontinued. On POD#2, she was transferred to the Mother Baby floor and routine care was given. Her BPs ranged 131-152 / 71-92.  She was out of bed independently and tolerating POs.  She was voiding and passing flatus. Potassium and Iron supplements were given.  She did not require antihypertensives. On POD#3, she was ready to go home. Her BPs ranged 123-150 / 61-82.   Her HR was RRR, Chest was CTAB.  Abdomen was soft and appropriately tender.  Incision was clean dry and intact.  Lochia normal. Extremities:  2+ edema, DTRs 2+, with negative Homans sign. Her potassium on POD2 was 2.8.   She was deemed to have received the full benefit of her hospital stay and was discharged home.    Hemoglobin  Date Value Range Status  10/15/2010 6.5* 12.0-15.0 (g/dL) Final     CRITICAL RESULT CALLED TO, READ BACK BY AND VERIFIED WITH:     HART,S. AT 0550 ON 10/15/2010 BY  HOUEGNIFIO M.     REPEATED TO VERIFY     DELTA CHECK NOTED     HCT  Date Value Range Status  10/15/2010 22.8* 36.0-46.0 (%) Final    Discharge Diagnoses: Term Pregnancy-delivered and Elective Repeat C/S        Preeclampsia, improved        Hypokalemia        Anemia        Gestational Diabetes, resolving        Desires Bilateral Tubal Ligation, procedure not able to be done due to extensive adhesions.  Discharge Information: Date: 10/15/2010 Activity: unrestricted and pelvic rest Diet: routine Medications: PNV, Ibuprophen, Iron and Percocet, HCTZ, KDur Condition: stable and improved Instructions: refer to practice specific booklet Discharge to: home Follow-up Information    Follow up with [facility]. Make an appointment in 2 weeks. (Come to MAU for Headache, vision change, heavy bleeding, or fever)          Newborn Data: Live born female  Birth Weight: 7 lb 7.9 oz (3400 g) APGAR: 7, 9  Home with mother.  Wynelle Bourgeois 10/15/2010, 10:24 AM

## 2010-10-16 LAB — TYPE AND SCREEN
Antibody Screen: NEGATIVE
Unit division: 0
Unit division: 0

## 2010-10-17 ENCOUNTER — Other Ambulatory Visit (HOSPITAL_COMMUNITY): Payer: Self-pay

## 2010-10-18 ENCOUNTER — Other Ambulatory Visit: Payer: Self-pay

## 2010-10-22 ENCOUNTER — Encounter (HOSPITAL_COMMUNITY): Admission: RE | Payer: Self-pay | Source: Ambulatory Visit

## 2010-10-22 ENCOUNTER — Inpatient Hospital Stay (HOSPITAL_COMMUNITY)
Admission: RE | Admit: 2010-10-22 | Payer: Medicaid Other | Source: Ambulatory Visit | Admitting: Obstetrics & Gynecology

## 2010-10-22 SURGERY — Surgical Case
Anesthesia: Choice

## 2010-11-27 ENCOUNTER — Emergency Department (HOSPITAL_COMMUNITY)
Admission: EM | Admit: 2010-11-27 | Discharge: 2010-11-27 | Disposition: A | Discharge status: Home / Self Care | Payer: Medicaid Other | Attending: Emergency Medicine | Admitting: Emergency Medicine

## 2010-11-27 DIAGNOSIS — I10 Essential (primary) hypertension: Secondary | ICD-10-CM | POA: Insufficient documentation

## 2010-11-27 DIAGNOSIS — N3090 Cystitis, unspecified without hematuria: Secondary | ICD-10-CM | POA: Insufficient documentation

## 2010-11-27 DIAGNOSIS — R319 Hematuria, unspecified: Secondary | ICD-10-CM | POA: Insufficient documentation

## 2010-11-27 LAB — URINALYSIS, ROUTINE W REFLEX MICROSCOPIC
Protein, ur: >300 mg/dL — AB
Urobilinogen, UA: 1 mg/dL (ref 0.0–1.0)

## 2010-11-27 LAB — URINE MICROSCOPIC-ADD ON

## 2010-11-28 ENCOUNTER — Ambulatory Visit: Payer: Medicaid Other | Admitting: Obstetrics and Gynecology

## 2010-11-28 LAB — URINE CULTURE: Culture  Setup Time: 201209110922

## 2010-11-30 ENCOUNTER — Encounter (HOSPITAL_COMMUNITY): Payer: Self-pay | Admitting: *Deleted

## 2010-12-18 ENCOUNTER — Encounter (HOSPITAL_COMMUNITY): Payer: Self-pay | Admitting: Obstetrics & Gynecology

## 2010-12-18 LAB — DIFFERENTIAL
Basophils Absolute: 0.1
Basophils Relative: 1
Eosinophils Absolute: 0.3
Eosinophils Relative: 4
Lymphocytes Relative: 39
Monocytes Absolute: 0.7

## 2010-12-18 LAB — CBC
HCT: 39
Hemoglobin: 12.9
MCHC: 33.1
MCV: 84.1
Platelets: 248
RDW: 13.9

## 2010-12-18 LAB — POCT I-STAT, CHEM 8
Calcium, Ion: 1.18
Chloride: 106
Creatinine, Ser: 1
Glucose, Bld: 70
HCT: 40

## 2010-12-18 LAB — POCT URINALYSIS DIP (DEVICE)
Nitrite: POSITIVE — AB
Specific Gravity, Urine: 1.02
pH: 6.5

## 2010-12-18 LAB — HEPATIC FUNCTION PANEL
Bilirubin, Direct: 0.2
Indirect Bilirubin: 0.5
Total Protein: 6.8

## 2010-12-18 LAB — SYPHILIS: RPR W/REFLEX TO RPR TITER AND TREPONEMAL ANTIBODIES, TRADITIONAL SCREENING AND DIAGNOSIS ALGORITHM: RPR Ser Ql: NONREACTIVE

## 2010-12-18 LAB — COMPREHENSIVE METABOLIC PANEL
AST: 20
Albumin: 3.8
Alkaline Phosphatase: 90
Chloride: 105
GFR calc Af Amer: >60
Potassium: 3.5
Total Bilirubin: 0.4
Total Protein: 6.5

## 2010-12-18 LAB — WET PREP, GENITAL
Clue Cells Wet Prep HPF POC: NONE SEEN
Trich, Wet Prep: NONE SEEN
Yeast Wet Prep HPF POC: NONE SEEN
Yeast Wet Prep HPF POC: NONE SEEN

## 2010-12-18 LAB — GC/CHLAMYDIA PROBE AMP, GENITAL
Chlamydia, DNA Probe: NEGATIVE
Chlamydia, DNA Probe: NEGATIVE
GC Probe Amp, Genital: POSITIVE — AB
GC Probe Amp, Genital: NEGATIVE

## 2010-12-18 LAB — HCG, QUANTITATIVE, PREGNANCY

## 2010-12-19 DIAGNOSIS — Z8659 Personal history of other mental and behavioral disorders: Secondary | ICD-10-CM

## 2010-12-19 DIAGNOSIS — D649 Anemia, unspecified: Secondary | ICD-10-CM | POA: Insufficient documentation

## 2010-12-19 DIAGNOSIS — I10 Essential (primary) hypertension: Secondary | ICD-10-CM

## 2010-12-20 ENCOUNTER — Ambulatory Visit: Payer: Medicaid Other | Admitting: Obstetrics and Gynecology

## 2011-05-16 ENCOUNTER — Emergency Department (HOSPITAL_COMMUNITY)
Admission: EM | Admit: 2011-05-16 | Discharge: 2011-05-20 | Disposition: A | Discharge status: Home / Self Care | Payer: Medicaid Other | Attending: Emergency Medicine | Admitting: Emergency Medicine

## 2011-05-16 ENCOUNTER — Encounter (HOSPITAL_COMMUNITY): Payer: Self-pay | Admitting: Emergency Medicine

## 2011-05-16 ENCOUNTER — Emergency Department (HOSPITAL_COMMUNITY): Payer: Medicaid Other

## 2011-05-16 ENCOUNTER — Other Ambulatory Visit: Payer: Self-pay

## 2011-05-16 DIAGNOSIS — I517 Cardiomegaly: Secondary | ICD-10-CM | POA: Insufficient documentation

## 2011-05-16 DIAGNOSIS — R0602 Shortness of breath: Secondary | ICD-10-CM | POA: Insufficient documentation

## 2011-05-16 DIAGNOSIS — R079 Chest pain, unspecified: Secondary | ICD-10-CM | POA: Insufficient documentation

## 2011-05-16 LAB — POCT PREGNANCY, URINE: Preg Test, Ur: NEGATIVE

## 2011-05-16 MED ORDER — GI COCKTAIL ~~LOC~~
30.0000 mL | Freq: Once | ORAL | Status: AC
  Administered 2011-05-16: 30 mL via ORAL
  Filled 2011-05-16: qty 30

## 2011-05-16 MED ORDER — GENTAMICIN IN SALINE 1.6-0.9 MG/ML-% IV SOLN: 80.0000 mg | Freq: Once | INTRAVENOUS | Status: DC

## 2011-05-16 MED ORDER — OXYCODONE-ACETAMINOPHEN 5-325 MG PO TABS
1.0000 | ORAL_TABLET | Freq: Once | ORAL | Status: AC
  Administered 2011-05-16: 1 via ORAL
  Filled 2011-05-16: qty 1

## 2011-05-16 MED ORDER — OXYCODONE-ACETAMINOPHEN 5-325 MG PO TABS: 1.0000 | ORAL_TABLET | ORAL | Status: AC | PRN

## 2011-05-16 MED ORDER — KETOROLAC TROMETHAMINE 60 MG/2ML IM SOLN
60.0000 mg | Freq: Once | INTRAMUSCULAR | Status: AC
  Administered 2011-05-16: 60 mg via INTRAMUSCULAR
  Filled 2011-05-16: qty 2

## 2011-05-16 MED ORDER — HYDROMORPHONE HCL PF 1 MG/ML IJ SOLN
1.0000 mg | Freq: Once | INTRAMUSCULAR | Status: AC
  Administered 2011-05-17: 1 mg via INTRAMUSCULAR
  Filled 2011-05-16: qty 1

## 2011-05-16 MED ORDER — IBUPROFEN 600 MG PO TABS: 600.0000 mg | ORAL_TABLET | Freq: Four times a day (QID) | ORAL | Status: AC | PRN

## 2011-05-16 NOTE — ED Notes (Signed)
EKG was done in Triage and signed by EDP. Pt was moved back to yellow side at 16:13 and EKG was given to attending EDP at 21:11.

## 2011-05-16 NOTE — ED Provider Notes (Signed)
History     CSN: [id number]  Arrival date & time 05/16/11  1540   First MD Initiated Contact with Patient 05/16/11 2018      Chief Complaint  Patient presents with  . Chest Pain    (Consider location/radiation/quality/duration/timing/severity/associated sxs/prior treatment) HPI Comments: 26 year old female presenting with a two-day history of centralized burning chest pain  Patient is a 26 y.o. female presenting with chest pain. The history is provided by the patient.  Chest Pain The chest pain began 2 days ago. Duration of episode(s) is 2 days. Chest pain occurs constantly. The chest pain is unchanged. Associated with: nothing. The pain is currently at 9/10. The severity of the pain is severe. The quality of the pain is described as burning. The pain does not radiate. Chest pain is worsened by certain positions and exertion (movement). Primary symptoms include shortness of breath. Pertinent negatives for primary symptoms include no syncope, no abdominal pain and no nausea. Treatments tried: tylenol #3 with only mild improvement. Risk factors include no known risk factors.     Past Medical History  Diagnosis Date  . Anemia   . Potassium (K) deficiency   . Gestational diabetes   . Gestational age 65-36 weeks     Past Surgical History  Procedure Date  . Cesarean section   . Tonsillectomy   . Cholecystectomy   . Cesarean section 10/12/2010    Procedure: CESAREAN SECTION;  Surgeon: Lazaro Arms, MD;  Location: [facility];  Service: Gynecology;  Laterality: N/A;  Repeat Cesarean Section with Delivery Baby Boy @ 1756; Apgars 7/9  . Cesarean section 10/12/2010    Procedure: CESAREAN SECTION;  Surgeon: Lazaro Arms, MD;  Location: [facility];  Service: Gynecology;  Laterality: N/A;    No family history on file.  History  Substance Use Topics  . Smoking status: Former Games developer  . Smokeless tobacco: Not on file  . Alcohol Use: No    OB History    Grav Para Term Preterm Abortions TAB  SAB Ect Mult Living   4 3 2 1      3       Review of Systems  Respiratory: Positive for shortness of breath.   Cardiovascular: Positive for chest pain. Negative for syncope.  Gastrointestinal: Negative for nausea and abdominal pain.  All other systems reviewed and are negative.    Allergies  Vicodin  Home Medications   Current Outpatient Rx  Name Route Sig Dispense Refill  . ACETAMINOPHEN-CODEINE #3 300-30 MG PO TABS Oral Take 3 tablets by mouth every 4 (four) hours as needed. For pain    . IRON PO Oral Take 1 tablet by mouth daily.       BP 98/68  Pulse 90  Temp(Src) 98.1 F (36.7 C) (Oral)  Resp 16  SpO2 99%  LMP 04/15/2011  Physical Exam  Nursing note and vitals reviewed. Constitutional: She is oriented to person, place, and time. She appears well-developed and well-nourished. No distress.  HENT:  Head: Normocephalic and atraumatic.  Mouth/Throat: Oropharynx is clear and moist.  Eyes: EOM are normal. Pupils are equal, round, and reactive to light.  Neck: Normal range of motion. Neck supple.  Cardiovascular: Normal rate, regular rhythm and normal heart sounds.  Exam reveals no friction rub.   No murmur heard. Pulmonary/Chest: Effort normal and breath sounds normal. No respiratory distress. She has no wheezes. She has no rales. She exhibits tenderness.       Pain reproducible with palpation of left anterior  chest.  Abdominal: Soft. There is no tenderness. There is no rebound and no guarding.  Musculoskeletal: Normal range of motion. She exhibits no edema and no tenderness.  Lymphadenopathy:    She has no cervical adenopathy.  Neurological: She is alert and oriented to person, place, and time.  Skin: Skin is warm and dry. No rash noted.  Psychiatric: She has a normal mood and affect. Her behavior is normal.    ED Course  Procedures (including critical care time)  Results for orders placed during the hospital encounter of 05/16/11  POCT PREGNANCY, URINE       Component Value Range   Preg Test, Ur NEGATIVE  NEGATIVE     Dg Chest 2 View  05/16/2011  *RADIOLOGY REPORT*  Clinical Data: Chest pain with high blood pressure.  CHEST - 2 VIEW  Comparison: None.  Findings: Cardiomegaly.  Low lung volumes.  No infiltrates or failure.  No effusion or pneumothorax.  Little change from priors.  IMPRESSION: Cardiomegaly, poor inspiratory effort.  No active infiltrates.  Original Report Authenticated By: Elsie Stain, M.D.   Imaging independently viewed by me, interpreted by radiologist.   Date: 05/17/2011  Rate: 88  Rhythm: normal sinus rhythm  QRS Axis: normal  Intervals: normal  ST/T Wave abnormalities: normal  Conduction Disutrbances:none  Narrative Interpretation:   Old EKG Reviewed: left atrial enlargement, nonspecific T wave abnormality   1. Chest pain       MDM  41:30 PM 26 year old female presenting with a two-day history a constant burning substernal chest pain she states worse with lying flat. She states that she's had a sleep upright for the past 2 nights. She also endorses shortness of breath and night sweats. She does have a dry cough. Pain is mildly worse with deep breathing. She denies any history of similar symptoms in the past. Pain is not worse with eating. She is afebrile her vital signs are stable. She is not hypoxic or tachycardic. Patient is PERC negative. We'll check EKG and chest x-ray, treat pain and reassess.   Chest x-ray and EKG are unremarkable. Chest x-ray does demonstrate stable cardiomegaly which he has had in the past. Her pain was transiently improved with Percocet and Toradol, however is now starting to return. Patient was given GI cocktail with mild improvement, also given IM Dilaudid with improvement. She is young and otherwise healthy, and the story is not consistent ACS. Doubt cardiac pathology. The patient is PERC negative so doubt PE. Pain certainly could be musculoskeletal in nature given that it is reproducible. It  could also be a GI component given some improvement with GI cocktail. Pericarditis remains in the differential, however she has no EKG changes and her cardiomegaly is stable, but this does seem less likely. It was suggested that she establish with a primary care physician for further evaluation of this pain along with her chronic back pain. We'll discharge her with a few Percocet and Motrin. She was given contact information to establish with internal medicine internal medicine. The patient was in agreement with this plan. She was given strong return precautions and discharged home in stable condition.      Sheran Luz, MD 05/17/11 [phone]

## 2011-05-16 NOTE — Discharge Instructions (Signed)
Chest Pain (Nonspecific) It is often hard to give a specific diagnosis for the cause of chest pain. There is always a chance that your pain could be related to something serious, such as a heart attack or a blood clot in the lungs. You need to follow up with your caregiver for further evaluation. CAUSES   Heartburn.   Pneumonia or bronchitis.   Anxiety and stress.   Inflammation around your heart (pericarditis) or lung (pleuritis or pleurisy).   A blood clot in the lung.   A collapsed lung (pneumothorax). It can develop suddenly on its own (spontaneous pneumothorax) or from injury (trauma) to the chest.  The chest wall is composed of bones, muscles, and cartilage. Any of these can be the source of the pain.  The bones can be bruised by injury.   The muscles or cartilage can be strained by coughing or overwork.   The cartilage can be affected by inflammation and become sore (costochondritis).  DIAGNOSIS  Lab tests or other studies, such as X-rays, an EKG, stress testing, or cardiac imaging, may be needed to find the cause of your pain.  TREATMENT   Treatment depends on what may be causing your chest pain. Treatment may include:   Acid blockers for heartburn.   Anti-inflammatory medicine.   Pain medicine for inflammatory conditions.   Antibiotics if an infection is present.   You may be advised to change lifestyle habits. This includes stopping smoking and avoiding caffeine and chocolate.   You may be advised to keep your head raised (elevated) when sleeping. This reduces the chance of acid going backward from your stomach into your esophagus.   Most of the time, nonspecific chest pain will improve within 2 to 3 days with rest and mild pain medicine.  HOME CARE INSTRUCTIONS   If antibiotics were prescribed, take the full amount even if you start to feel better.   For the next few days, avoid physical activities that bring on chest pain. Continue physical activities as  directed.   Do not smoke cigarettes or drink alcohol until your symptoms are gone.   Only take over-the-counter or prescription medicine for pain, discomfort, or fever as directed by your caregiver.   Follow your caregiver's suggestions for further testing if your chest pain does not go away.   Keep any follow-up appointments you made. If you do not go to an appointment, you could develop lasting (chronic) problems with pain. If there is any problem keeping an appointment, you must call to reschedule.  SEEK MEDICAL CARE IF:   You think you are having problems from the medicine you are taking. Read your medicine instructions carefully.   Your chest pain does not go away, even after treatment.   You develop a rash with blisters on your chest.  SEEK IMMEDIATE MEDICAL CARE IF:   You have increased chest pain or pain that spreads to your arm, neck, jaw, back, or belly (abdomen).   You develop shortness of breath, an increasing cough, or you are coughing up blood.   You have severe back or abdominal pain, feel sick to your stomach (nauseous) or throw up (vomit).   You develop severe weakness, fainting, or chills.   You have an oral temperature above 102 F (38.9 C), not controlled by medicine.  THIS IS AN EMERGENCY. Do not wait to see if the pain will go away. Get medical help at once. Call your local emergency services (911 in [place] Do not drive yourself to   the hospital. MAKE SURE YOU:   Understand these instructions.   Will watch your condition.   Will get help right away if you are not doing well or get worse.  Document Released: 12/12/2004 Document Revised: 11/14/2010 Document Reviewed: 10/08/2007 [employer] Patient Information 2012 [employer]. 

## 2011-05-16 NOTE — ED Notes (Signed)
Pt c/o mid chest pain, nonproductive cough, LLQ pain x's 4 days.  St's she has diaphoresis at night

## 2011-05-16 NOTE — ED Provider Notes (Addendum)
I saw and evaluated the patient, reviewed the resident's note and I agree with the findings and plan. I agree with resident ECG interpret.  Pt with unremarkable ECG, improved compared to ECG from 2012.  Pt with atypical symptoms, sharp pain, worse with laying flat.  Improved with sitting up.  Some dry coughing by RN history.  CXR which I reviewed shows no acute abn per radiologist.  No sig cardiac risk factors, not on OCP's.  Pt is former smoker.  Will treat pain, possibly pt has inflammatory CP or musculoskeletal wall pain.  NSAIDs and will reassess.  Doubt ACS.    Lavonya Hoerner Y.   Gavin Pound. Oletta Lamas, MD 05/17/11 1610  Gavin Pound. Oletta Lamas, MD 05/19/11 2131

## 2011-05-17 NOTE — ED Notes (Addendum)
Pt discharged.  However, she was recently given dilaudid and she drove herself.  Charge RN notified and stated to hold her for 1 hour and then re-assess to see if she can drive home.

## 2011-05-17 NOTE — ED Notes (Signed)
Pt continues to feel pain 10/10.  VS stable.

## 2011-05-17 NOTE — ED Notes (Signed)
Pt called her sister to come take her home. Per pt, sister is supposed to meet her in the  ED waiting room. Pt is fully alert and oriented. Re-iterated that pt cannot drive due to medication. She stated again that her sister is driving her home

## 2011-05-19 NOTE — ED Provider Notes (Signed)
See my prior note for attestation statement  Gavin Pound. Oletta Lamas, MD 05/19/11 2131

## 2011-07-24 ENCOUNTER — Encounter (HOSPITAL_COMMUNITY): Payer: Self-pay

## 2011-07-24 ENCOUNTER — Encounter (HOSPITAL_COMMUNITY): Payer: Self-pay | Admitting: *Deleted

## 2011-07-24 ENCOUNTER — Emergency Department (HOSPITAL_COMMUNITY)
Admission: EM | Admit: 2011-07-24 | Discharge: 2011-07-24 | Disposition: A | Discharge status: Home / Self Care | Payer: Medicaid Other | Attending: Emergency Medicine | Admitting: Emergency Medicine

## 2011-07-24 ENCOUNTER — Emergency Department (HOSPITAL_COMMUNITY)
Admission: EM | Admit: 2011-07-24 | Discharge: 2011-07-24 | Disposition: A | Discharge status: Home / Self Care | Payer: Medicaid Other | Source: Home / Self Care | Attending: Emergency Medicine | Admitting: Emergency Medicine

## 2011-07-24 DIAGNOSIS — M545 Low back pain, unspecified: Secondary | ICD-10-CM | POA: Insufficient documentation

## 2011-07-24 DIAGNOSIS — M542 Cervicalgia: Secondary | ICD-10-CM | POA: Insufficient documentation

## 2011-07-24 DIAGNOSIS — H9209 Otalgia, unspecified ear: Secondary | ICD-10-CM | POA: Insufficient documentation

## 2011-07-24 DIAGNOSIS — K029 Dental caries, unspecified: Secondary | ICD-10-CM | POA: Insufficient documentation

## 2011-07-24 DIAGNOSIS — M549 Dorsalgia, unspecified: Secondary | ICD-10-CM

## 2011-07-24 DIAGNOSIS — K089 Disorder of teeth and supporting structures, unspecified: Secondary | ICD-10-CM | POA: Insufficient documentation

## 2011-07-24 DIAGNOSIS — K0889 Other specified disorders of teeth and supporting structures: Secondary | ICD-10-CM

## 2011-07-24 MED ORDER — PENICILLIN V POTASSIUM 500 MG PO TABS: 500.0000 mg | ORAL_TABLET | Freq: Three times a day (TID) | ORAL | Status: AC

## 2011-07-24 MED ORDER — OXYCODONE-ACETAMINOPHEN 5-325 MG PO TABS: 1.0000 | ORAL_TABLET | ORAL | Status: DC | PRN

## 2011-07-24 MED ORDER — PENICILLIN V POTASSIUM 250 MG PO TABS
500.0000 mg | ORAL_TABLET | Freq: Once | ORAL | Status: AC
  Administered 2011-07-24: 500 mg via ORAL
  Filled 2011-07-24 (×2): qty 1
  Filled 2011-07-24: qty 2

## 2011-07-24 MED ORDER — OXYCODONE-ACETAMINOPHEN 5-325 MG PO TABS
1.0000 | ORAL_TABLET | Freq: Once | ORAL | Status: AC
  Administered 2011-07-24: 1 via ORAL
  Filled 2011-07-24 (×2): qty 1

## 2011-07-24 MED ORDER — PENICILLIN V POTASSIUM 500 MG PO TABS: 500.0000 mg | ORAL_TABLET | Freq: Four times a day (QID) | ORAL | Status: DC

## 2011-07-24 NOTE — Discharge Instructions (Signed)
Dental Caries Dental caries (cavities) are areas of tooth decay. Cavities are usually caused by a combination of poor dental care; sugar; tobacco, alcohol, and drug abuse; decreased saliva production; and receding gums. If cavities are not treated by a dentist, they grow in size. This can cause toothaches, infection, and loss of the tooth. Cavities of the outer tooth enamel do not cause symptoms. Dental pain from cold drinks may be the first sign the enamel has broken down and decay has spread toward the root of the tooth. This can cause the tooth to die or become infected. If a cavity is treated before it causes toothache, the tooth can usually be saved. Cavities can be prevented by good oral hygiene. Brushing your teeth in the morning and before bed, and using dental floss once daily helps remove plaque and reduce bacteria. Candy, soft drinks, and other sources of sugar promote tooth decay by promoting the growth of bacteria in the mouth. Proper diet, fluoride, dental cleaning, and fillings are important in preventing the loss of teeth from decay. Antibiotics, root canal treatment, or dental extraction may be needed if the decay is severe. Take any pain medication or antibiotics as directed by your caregiver. It is important that you follow up with a dentist for definitive care. SEEK MEDICAL CARE IF:   You or your child has an oral temperature above 102 F (38.9 C).   There is difficulty opening the mouth.   There is difficulty swallowing or handling secretions.   There is difficulty breathing.   There is chest pain.   There are worsening or concerning symptoms.  Document Released: 04/11/2004 Document Revised: 02/21/2011 Document Reviewed: 06/27/2009 [facility] Patient Information 2012 [place], [place].  RESOURCE GUIDE  Dental Problems  Patients with Medicaid: [facility] [phone]                                           [phone]  [address] [employer] Phone:  [phone]                                                  Phone:  [phone]  If unable to pay or uninsured, contact:  [facility] or [facility]. to become qualified for the adult dental clinic.  Chronic Pain Problems Contact [facility]  [phone] Patients need to be referred by their primary care doctor.  Insufficient Money for Medicine Contact [employer]:  call "211" or [facility] 9377201822.  No Primary Care Doctor Call [facility]  716-632-3547 Other agencies that provide inexpensive medical care    [facility]  [phone]    [facility]  [phone]    [facility]  [phone]    [facility]  [phone]    [facility]  [phone]    [facility]  [phone]  Psychological Services [facility]  [phone] [facility] Services  [phone] [facility]   [phone] (emergency services 9383024125)  Substance Abuse Resources [facility]  (636) [phone] [facility] [phone] [facility] [phone] [facility] 403-611-1894 Residential & Outpatient Substance Abuse Program  9546667637  Abuse/Neglect [facility] Child Abuse Hotline 226-547-6605 [facility] Child Abuse Hotline (726)046-3776 (After Hours)  Emergency Shelter [facility] 585-088-1958  M[facility] [place] [facility] (850)326-6240 [facility] [phone]  MRSA Hotline #:   951-091-8319    [facility] Resources  [facility]     [employer]                          [facility] Dept. [address] [place]                       [address]      [address]  [place]ell Reveal Phone:  [phone]                                   Phone:  [phone]                  Phone:  [phone]  [facility] Phone:  [phone]  [facility] Child Abuse Hotline 201-639-1347 (856) 315-2352 (After Hours)

## 2011-07-24 NOTE — ED Notes (Signed)
Pt left before receiving discharge instructions.

## 2011-07-24 NOTE — ED Provider Notes (Signed)
History     CSN: [id number]  Arrival date & time 07/24/11  1234   First MD Initiated Contact with Patient 07/24/11 1250      Chief Complaint  Patient presents with  . Dental Pain    (Consider location/radiation/quality/duration/timing/severity/associated sxs/prior treatment) Patient is a 26 y.o. female presenting with tooth pain. The history is provided by the patient.  Dental PainThe primary symptoms include mouth pain. Primary symptoms do not include dental injury or fever. The symptoms began 2 days ago. The symptoms are new. The symptoms occur constantly.  Additional symptoms include: gum swelling, gum tenderness and jaw pain. Additional symptoms do not include: facial swelling.  Pt with left upper toothache for last 2 days. No improvement with pain tylenol #3 or ibuprofen. No fever, chills. Also reports lower back pain worsening over last two days. States pain in the lower back, does not radiate.NO problems with urination, no loss of bowels, or incontinence. NO other complaints.   Past Medical History  Diagnosis Date  . Anemia   . Potassium (K) deficiency   . Gestational diabetes   . Gestational age 58-36 weeks     Past Surgical History  Procedure Date  . Cesarean section   . Tonsillectomy   . Cholecystectomy   . Cesarean section 10/12/2010    Procedure: CESAREAN SECTION;  Surgeon: Lazaro Arms, MD;  Location: [facility];  Service: Gynecology;  Laterality: N/A;  Repeat Cesarean Section with Delivery Baby Boy @ 1756; Apgars 7/9  . Cesarean section 10/12/2010    Procedure: CESAREAN SECTION;  Surgeon: Lazaro Arms, MD;  Location: [facility];  Service: Gynecology;  Laterality: N/A;    No family history on file.  History  Substance Use Topics  . Smoking status: Former Games developer  . Smokeless tobacco: Not on file  . Alcohol Use: No    OB History    Grav Para Term Preterm Abortions TAB SAB Ect Mult Living   4 3 2 1      3       Review of Systems  Constitutional: Negative for  fever and chills.  HENT: Positive for dental problem. Negative for facial swelling.   Respiratory: Negative.   Cardiovascular: Negative.   Genitourinary: Negative.   Musculoskeletal: Negative.   Skin: Negative.   Neurological: Negative.     Allergies  Vicodin  Home Medications   Current Outpatient Rx  Name Route Sig Dispense Refill  . ACETAMINOPHEN-CODEINE #3 300-30 MG PO TABS Oral Take 3 tablets by mouth every 4 (four) hours as needed. For pain    . FERROUS SULFATE 325 (65 FE) MG PO TABS Oral Take 325 mg by mouth daily with breakfast.      BP 110/86  Pulse 69  Temp 98.8 F (37.1 C)  Resp 18  SpO2 100%  Physical Exam  Nursing note and vitals reviewed. Constitutional: She is oriented to person, place, and time. She appears well-developed and well-nourished. No distress.  HENT:  Head: Normocephalic.       Normal dentition. No obvious mouth or gum swelling. Tender over left upper 2nd molar. No obvious abscess  Eyes: Conjunctivae are normal.  Neck: Neck supple.  Cardiovascular: Normal rate, regular rhythm and normal heart sounds.   Pulmonary/Chest: Effort normal. No respiratory distress. She has no wheezes.  Musculoskeletal:       No midline lower back tenderness, right paravertebral tenderness. No pain with straight leg raise.   Neurological: She is alert and oriented to person, place, and time.  Normal gait. 5/5 equal LE strength  Skin: Skin is warm and dry.  Psychiatric: She has a normal mood and affect.    ED Course  Procedures (including critical care time)  Pt with dental pain and back pain. Pt was interviewd, examined. Asked pt if she took anything today for her pain, she denied. Based on records pt was just seen at [facility] ED, was given a percocet in ED and prescription for percocet. Pt states she never got the pill and she left before discharge papers were given to her. I called [facility] personally, and verified, directly with her nurse that she did  receive medication there and she was handed papers and a prescription into her hand. I do not feel appropriate writing her another prescription for pain medication. Will refer to a dentist.  1. Toothache   2. Back pain       MDM          Lottie Mussel, PA 07/24/11 1611

## 2011-07-24 NOTE — ED Notes (Signed)
Patient here with left upper dental pain, reports that she thinks that the tooth may be broken, pain into ear. Was at [facility] ED and reports that she left prior to being seen

## 2011-07-24 NOTE — Discharge Instructions (Signed)
Continue ibuprofen for pain. Take penicillin as prescribed until all gone for infection.  Follow up with a dentist as referred. Follow up with primary care doctor for your back problems.   Dental Caries  Tooth decay (dental caries, cavities) is the most common of all oral diseases. It occurs in all ages but is more common in children and young adults.  CAUSES  Bacteria in your mouth combine with foods (particularly sugars and starches) to produce plaque. Plaque is a substance that sticks to the hard surfaces of teeth. The bacteria in the plaque produce acids that attack the enamel of teeth. Repeated acid attacks dissolve the enamel and create holes in the teeth. Root surfaces of teeth may also get these holes.  Other contributing factors include:   Frequent snacking and drinking of cavity-producing foods and liquids.   Poor oral hygiene.   Dry mouth.   Substance abuse such as methamphetamine.   Broken or poor fitting dental restorations.   Eating disorders.   Gastroesophageal reflux disease (GERD).   Certain radiation treatments to the head and neck.  SYMPTOMS  At first, dental decay appears as white, chalky areas on the enamel. In this early stage, symptoms are seldom present. As the decay progresses, pits and holes may appear on the enamel surfaces. Progression of the decay will lead to softening of the hard layers of the tooth. At this point you may experience some pain or achy feeling after sweet, hot, or cold foods or drinks are consumed. If left untreated, the decay will reach the internal structures of the tooth and produce severe pain. Extensive dental treatment, such as root canal therapy, may be needed to save the tooth at this late stage of decay development.  DIAGNOSIS  Most cavities will be detected during regular check-ups. A thorough medical and dental history will be taken by the dentist. The dentist will use instruments to check the surfaces of your teeth for any breakdown  or discoloration. Some dentists have special instruments, such as lasers, that detect tooth decay. Dental X-rays may also show some cavities that are not visible to the eye (such as between the contact areas of the teeth). TREATMENT  Treatment involves removal of the tooth decay and replacement with a restorative material such as silver, gold, or composite (white) material. However, if the decay involves a large area of the tooth and there is little remaining healthy tooth structure, a cap (crown) will be fitted over the remaining structure. If the decay involves the center part of the tooth (pulp), root canal treatment will be needed before any type of dental restoration is placed. If the tooth is severely destroyed by the decay process, leaving the remaining tooth structures unrestorable, the tooth will need to be pulled (extracted). Some early tooth decay may be reversed by fluoride treatments and thorough brushing and flossing at home. PREVENTION   Eat healthy foods. Restrict the amount of sugary, starchy foods and liquids you consume. Avoid frequent snacking and drinking of unhealthy foods and liquids.   Sealants can help with prevention of cavities. Sealants are composite resins applied onto the biting surfaces of teeth at risk for decay. They smooth out the pits and grooves and prevent food from being trapped in them. This is done in early childhood before tooth decay has started.   Fluoride tablets may also be prescribed to children between 6 months and 43 years of age if your drinking water is not fluoridated. The fluoride absorbed by the tooth enamel  makes teeth less susceptible to decay. Thorough daily cleaning with a toothbrush and dental floss is the best way to prevent cavities. Use of a fluoride toothpaste is highly recommended. Fluoride mouth rinses may be used in specific cases.   Topical application of fluoride by your dentist is important in children.   Regular visits with a dentist  for checkups and cleanings are also important.  SEEK IMMEDIATE DENTAL CARE IF:  You have a fever.   You develop redness and swelling of your face, jaw, or neck.   You develop swelling around a tooth.   You are unable to open your mouth or cannot swallow.   You have severe pain uncontrolled by pain medicine.  Document Released: 11/24/2001 Document Revised: 02/21/2011 Document Reviewed: 08/09/2010 [facility] Patient Information 2012 [place], [place].

## 2011-07-24 NOTE — ED Provider Notes (Signed)
History     CSN: [id number]  Arrival date & time 07/24/11  8295   First MD Initiated Contact with Patient 07/24/11 1056      Chief Complaint  Patient presents with  . Facial Pain  . Otalgia    (Consider location/radiation/quality/duration/timing/severity/associated sxs/prior treatment) Patient is a 26 y.o. female presenting with ear pain. The history is provided by the patient.  Otalgia This is a new problem. The current episode started 2 days ago. There is pain in the left ear. The problem occurs constantly. The problem has been gradually worsening. There has been no fever. Associated symptoms include neck pain. Associated symptoms comments: Left sided facial pain that started with toothache and now extends to ear and left neck. No fever, N, V..    Past Medical History  Diagnosis Date  . Anemia   . Potassium (K) deficiency   . Gestational diabetes   . Gestational age 45-36 weeks     Past Surgical History  Procedure Date  . Cesarean section   . Tonsillectomy   . Cholecystectomy   . Cesarean section 10/12/2010    Procedure: CESAREAN SECTION;  Surgeon: Lazaro Arms, MD;  Location: [facility];  Service: Gynecology;  Laterality: N/A;  Repeat Cesarean Section with Delivery Baby Boy @ 1756; Apgars 7/9  . Cesarean section 10/12/2010    Procedure: CESAREAN SECTION;  Surgeon: Lazaro Arms, MD;  Location: [facility];  Service: Gynecology;  Laterality: N/A;    History reviewed. No pertinent family history.  History  Substance Use Topics  . Smoking status: Former Games developer  . Smokeless tobacco: Not on file  . Alcohol Use: No    OB History    Grav Para Term Preterm Abortions TAB SAB Ect Mult Living   4 3 2 1      3       Review of Systems  Constitutional: Negative for fever.  HENT: Positive for ear pain, neck pain and dental problem.     Allergies  Vicodin  Home Medications   Current Outpatient Rx  Name Route Sig Dispense Refill  . ACETAMINOPHEN-CODEINE #3 300-30 MG PO TABS  Oral Take 3 tablets by mouth every 4 (four) hours as needed. For pain    . IRON PO Oral Take 1 tablet by mouth daily.       BP 169/146  Pulse 76  Temp(Src) 98.4 F (36.9 C) (Oral)  Resp 16  SpO2 100%  Physical Exam  Constitutional: She appears well-developed and well-nourished.  HENT:       Generally good dentition with severe erosion of upper rear left molar. Facial tenderness along maxillary line to ear. No lymphadenopathy.  Neck: Normal range of motion.  Pulmonary/Chest: Effort normal.    ED Course  Procedures (including critical care time)  Labs Reviewed - No data to display No results found.   No diagnosis found.  1. Dental caries   MDM          Rodena Medin, PA-C 07/24/11 1150

## 2011-07-24 NOTE — ED Notes (Signed)
Pt reports left sided facial pain with left ear discomfort x 2 days, reports unable to sleep.

## 2011-07-24 NOTE — ED Provider Notes (Signed)
Medical screening examination/treatment/procedure(s) were performed by non-physician practitioner and as supervising physician I was immediately available for consultation/collaboration.  Flint Melter, MD 07/24/11 2122

## 2011-07-29 NOTE — ED Provider Notes (Signed)
Medical screening examination/treatment/procedure(s) were performed by non-physician practitioner and as supervising physician I was immediately available for consultation/collaboration.  Elgie Landino, MD 07/29/11 0955 

## 2011-08-14 ENCOUNTER — Inpatient Hospital Stay (HOSPITAL_COMMUNITY)
Admission: AD | Admit: 2011-08-14 | Discharge: 2011-08-14 | Disposition: A | Discharge status: Home / Self Care | Payer: Medicaid Other | Source: Ambulatory Visit | Attending: Obstetrics & Gynecology | Admitting: Obstetrics & Gynecology

## 2011-08-14 ENCOUNTER — Inpatient Hospital Stay (HOSPITAL_COMMUNITY): Payer: Medicaid Other

## 2011-08-14 ENCOUNTER — Encounter (HOSPITAL_COMMUNITY): Payer: Self-pay | Admitting: *Deleted

## 2011-08-14 DIAGNOSIS — Z1389 Encounter for screening for other disorder: Secondary | ICD-10-CM

## 2011-08-14 DIAGNOSIS — O418X10 Other specified disorders of amniotic fluid and membranes, first trimester, not applicable or unspecified: Secondary | ICD-10-CM

## 2011-08-14 DIAGNOSIS — R109 Unspecified abdominal pain: Secondary | ICD-10-CM | POA: Insufficient documentation

## 2011-08-14 DIAGNOSIS — Z3490 Encounter for supervision of normal pregnancy, unspecified, unspecified trimester: Secondary | ICD-10-CM

## 2011-08-14 DIAGNOSIS — O368990 Maternal care for other specified fetal problems, unspecified trimester, not applicable or unspecified: Secondary | ICD-10-CM

## 2011-08-14 DIAGNOSIS — O209 Hemorrhage in early pregnancy, unspecified: Secondary | ICD-10-CM | POA: Insufficient documentation

## 2011-08-14 LAB — URINALYSIS, ROUTINE W REFLEX MICROSCOPIC
Glucose, UA: NEGATIVE mg/dL
Hgb urine dipstick: NEGATIVE
Ketones, ur: NEGATIVE mg/dL
Protein, ur: NEGATIVE mg/dL
pH: 6 (ref 5.0–8.0)

## 2011-08-14 LAB — WET PREP, GENITAL
Trich, Wet Prep: NONE SEEN
Yeast Wet Prep HPF POC: NONE SEEN

## 2011-08-14 LAB — CBC
MCH: 24.4 pg — ABNORMAL LOW (ref 26.0–34.0)
MCHC: 32.1 g/dL (ref 30.0–36.0)
Platelets: 282 10*3/uL (ref 150–400)
RBC: 4.63 MIL/uL (ref 3.87–5.11)
RDW: 15.6 % — ABNORMAL HIGH (ref 11.5–15.5)

## 2011-08-14 LAB — HCG, QUANTITATIVE, PREGNANCY: hCG, Beta Chain, Quant, S: 41644 m[IU]/mL — ABNORMAL HIGH (ref <5)

## 2011-08-14 LAB — DIFFERENTIAL
Basophils Absolute: 0 10*3/uL (ref 0.0–0.1)
Basophils Relative: 0 % (ref 0–1)
Eosinophils Absolute: 0.4 10*3/uL (ref 0.0–0.7)
Eosinophils Relative: 6 % — ABNORMAL HIGH (ref 0–5)
Neutrophils Relative %: 46 % (ref 43–77)

## 2011-08-14 NOTE — MAU Note (Signed)
C/o N&V for past 2 days and then diarrhea started today; confirmed early pregnant with pain in c-section scar; c-section was about 11 months ago;

## 2011-08-14 NOTE — MAU Note (Signed)
Patient states she had her last cesarean section in July 2012. Has been having pain at the scar site off and on. Missed May period.No bleeding.

## 2011-08-14 NOTE — MAU Provider Note (Signed)
History     CSN: [id number]  Arrival date & time 08/14/11  1022   None     Chief Complaint  Patient presents with  . Possible Pregnancy   HPI Brianna Reeves is a 26 y.o. female @ [redacted]w[redacted]d gestation who presents to MAU for pain in lower abdomen. The pain is located over the c-section scar.  Last pap smear less than one year ago and was normal. Current sex partner x 3 years. No history of STI's. Last sexual intercourse about 2 months ago and no pain at that time. The history was provided by the patient.  Past Medical History  Diagnosis Date  . Anemia   . Potassium (K) deficiency   . Gestational diabetes   . Gestational age 22-36 weeks     Past Surgical History  Procedure Date  . Cesarean section   . Tonsillectomy   . Cholecystectomy   . Cesarean section 10/12/2010    Procedure: CESAREAN SECTION;  Surgeon: Lazaro Arms, MD;  Location: [facility];  Service: Gynecology;  Laterality: N/A;  Repeat Cesarean Section with Delivery Baby Boy @ 1756; Apgars 7/9  . Cesarean section 10/12/2010    Procedure: CESAREAN SECTION;  Surgeon: Lazaro Arms, MD;  Location: [facility];  Service: Gynecology;  Laterality: N/A;    Family History  Problem Relation Age of Onset  . Hypertension Mother   . Diabetes Mother   . Hypertension Father   . Diabetes Father   . Hypertension Maternal Grandmother   . Diabetes Maternal Grandmother     History  Substance Use Topics  . Smoking status: Former Games developer  . Smokeless tobacco: Not on file  . Alcohol Use: No    OB History    Grav Para Term Preterm Abortions TAB SAB Ect Mult Living   5 3 2 1      3       Review of Systems  Constitutional: Positive for fatigue. Negative for fever, chills and diaphoresis.  HENT: Negative for ear pain, congestion, sore throat, facial swelling, neck pain, neck stiffness, dental problem and sinus pressure.   Eyes: Negative for photophobia, pain and discharge.  Respiratory: Negative for cough, chest tightness and wheezing.     Gastrointestinal: Positive for nausea and abdominal pain. Negative for vomiting, diarrhea, constipation and abdominal distention.  Genitourinary: Positive for pelvic pain. Negative for dysuria, frequency, flank pain, vaginal bleeding, vaginal discharge and difficulty urinating.  Musculoskeletal: Positive for back pain. Negative for myalgias and gait problem.  Skin: Negative for color change and rash.  Neurological: Positive for headaches. Negative for dizziness, speech difficulty, weakness, light-headedness and numbness.  Psychiatric/Behavioral: Negative for confusion and agitation. The patient is not nervous/anxious.     Allergies  Vicodin  Home Medications  No current outpatient prescriptions on file.  BP 97/62  Pulse 86  Temp(Src) 98.6 F (37 C) (Oral)  Resp 16  Ht 5\' 6"  (1.676 m)  Wt 240 lb 9.6 oz (109.135 kg)  BMI 38.83 kg/m2  SpO2 100%  LMP 06/21/2011  Breastfeeding? Unknown  Physical Exam  Nursing note and vitals reviewed. Constitutional: She is oriented to person, place, and time. She appears well-developed and well-nourished.  HENT:  Head: Normocephalic.  Eyes: EOM are normal.  Neck: Neck supple.  Cardiovascular: Normal rate.   Pulmonary/Chest: Effort normal.  Abdominal: Soft.       Minimal tenderness low mid abdomen. No guarding or rebound.  Genitourinary:       External genitalia without lesions. Scant blood  vaginal vault. Cervix long, closed, no CMT, no adnexal tenderness. Uterus slightly enlarged.  Musculoskeletal: Normal range of motion.  Neurological: She is alert and oriented to person, place, and time. No cranial nerve deficit.  Skin: Skin is warm and dry.  Psychiatric: She has a normal mood and affect. Her behavior is normal. Judgment and thought content normal.   Results for orders placed during the hospital encounter of 08/14/11 (from the past 24 hour(s))  URINALYSIS, ROUTINE W REFLEX MICROSCOPIC     Status: Normal   Collection Time   08/14/11 10:55  AM      Component Value Range   Color, Urine YELLOW  YELLOW    APPearance CLEAR  CLEAR    Specific Gravity, Urine 1.025  1.005 - 1.030    pH 6.0  5.0 - 8.0    Glucose, UA NEGATIVE  NEGATIVE (mg/dL)   Hgb urine dipstick NEGATIVE  NEGATIVE    Bilirubin Urine NEGATIVE  NEGATIVE    Ketones, ur NEGATIVE  NEGATIVE (mg/dL)   Protein, ur NEGATIVE  NEGATIVE (mg/dL)   Urobilinogen, UA 0.2  0.0 - 1.0 (mg/dL)   Nitrite NEGATIVE  NEGATIVE    Leukocytes, UA NEGATIVE  NEGATIVE   POCT PREGNANCY, URINE     Status: Abnormal   Collection Time   08/14/11 11:00 AM      Component Value Range   Preg Test, Ur POSITIVE (*) NEGATIVE   CBC     Status: Abnormal   Collection Time   08/14/11  1:51 PM      Component Value Range   WBC 7.8  4.0 - 10.5 (K/uL)   RBC 4.63  3.87 - 5.11 (MIL/uL)   Hemoglobin 11.3 (*) 12.0 - 15.0 (g/dL)   HCT 16.1 (*) 09.6 - 46.0 (%)   MCV 76.0 (*) 78.0 - 100.0 (fL)   MCH 24.4 (*) 26.0 - 34.0 (pg)   MCHC 32.1  30.0 - 36.0 (g/dL)   RDW 04.5 (*) 40.9 - 15.5 (%)   Platelets 282  150 - 400 (K/uL)  DIFFERENTIAL     Status: Abnormal   Collection Time   08/14/11  1:51 PM      Component Value Range   Neutrophils Relative 46  43 - 77 (%)   Neutro Abs 3.6  1.7 - 7.7 (K/uL)   Lymphocytes Relative 41  12 - 46 (%)   Lymphs Abs 3.2  0.7 - 4.0 (K/uL)   Monocytes Relative 7  3 - 12 (%)   Monocytes Absolute 0.5  0.1 - 1.0 (K/uL)   Eosinophils Relative 6 (*) 0 - 5 (%)   Eosinophils Absolute 0.4  0.0 - 0.7 (K/uL)   Basophils Relative 0  0 - 1 (%)   Basophils Absolute 0.0  0.0 - 0.1 (K/uL)  HCG, QUANTITATIVE, PREGNANCY     Status: Abnormal   Collection Time   08/14/11  2:12 PM      Component Value Range   hCG, Beta Chain, Quant, S 41644 (*) <5 (mIU/mL)   ED Course  Procedures Ultrasound shows a 7 week 3 day IUP with cardiac activity at 129 bpm, small [facility], EDD 03/27/12.   Assessment: Viable IUP at 7 weeks and 3 days   Discomforts of pregnancy   Small [facility]  Plan:  Tylenol as needed for  pain   Pregnancy verification letter   Start prenatal care   Return as needed. MDM

## 2011-08-14 NOTE — Discharge Instructions (Signed)
    ________________________________________     To schedule your Maternity Eligibility Appointment, please call [phone].  When you arrive for your appointment you must bring the following items or information listed below.  Your appointment will be rescheduled if you do not have these items or are 15 minutes late. If currently receiving Medicaid, you MUST bring: 1. Medicaid Card 2. Social Security Card 3. Picture ID 4. Proof of Pregnancy 5. Verification of current address if the address on Medicaid card is incorrect "postmarked mail" If not receiving Medicaid, you MUST bring: 1. Social Security Card 2. Picture ID 3. Birth Certificate (if available) Passport or *Green Card 4. Proof of Pregnancy 5. Verification of current address "postmarked mail" for each income presented. 6. Verification of insurance coverage, if any 7. Check stubs from each employer for the previous month (if unable to present check stub  for each week, we will accept check stub for the first and last week ill the same month.) If you can't locate check stubs, you must bring a letter from the employer(s) and it must have the following information on letterhead, typed, in English: o name of company o company telephone number o how long been with the company, if less than one month o how much person earns per hour o how many hours per week work o the gross pay the person earned for the previous month If you are 24 years old or less, you do not have to bring proof of income unless you work or live with the father of the baby and at that time we will need proof of income from you and/or the father of the baby. Green Card recipients are eligible for Medicaid for Pregnant Women (MPW)    

## 2011-08-15 LAB — GC/CHLAMYDIA PROBE AMP, GENITAL: GC Probe Amp, Genital: NEGATIVE

## 2011-09-07 ENCOUNTER — Inpatient Hospital Stay (HOSPITAL_COMMUNITY)
Admission: AD | Admit: 2011-09-07 | Discharge: 2011-09-07 | Disposition: A | Discharge status: Home / Self Care | Payer: Medicaid Other | Source: Ambulatory Visit | Attending: Family Medicine | Admitting: Family Medicine

## 2011-09-07 ENCOUNTER — Encounter (HOSPITAL_COMMUNITY): Payer: Self-pay | Admitting: *Deleted

## 2011-09-07 DIAGNOSIS — O99891 Other specified diseases and conditions complicating pregnancy: Secondary | ICD-10-CM | POA: Insufficient documentation

## 2011-09-07 DIAGNOSIS — M545 Low back pain, unspecified: Secondary | ICD-10-CM | POA: Insufficient documentation

## 2011-09-07 DIAGNOSIS — R109 Unspecified abdominal pain: Secondary | ICD-10-CM | POA: Insufficient documentation

## 2011-09-07 DIAGNOSIS — M549 Dorsalgia, unspecified: Secondary | ICD-10-CM

## 2011-09-07 DIAGNOSIS — N949 Unspecified condition associated with female genital organs and menstrual cycle: Secondary | ICD-10-CM | POA: Insufficient documentation

## 2011-09-07 DIAGNOSIS — O26899 Other specified pregnancy related conditions, unspecified trimester: Secondary | ICD-10-CM

## 2011-09-07 HISTORY — DX: Major depressive disorder, single episode, unspecified: F32.9

## 2011-09-07 HISTORY — DX: Gestational (pregnancy-induced) hypertension without significant proteinuria, unspecified trimester: O13.9

## 2011-09-07 HISTORY — DX: Headache: R51

## 2011-09-07 HISTORY — DX: Depression, unspecified: F32.A

## 2011-09-07 HISTORY — DX: Urinary tract infection, site not specified: N39.0

## 2011-09-07 LAB — URINALYSIS, ROUTINE W REFLEX MICROSCOPIC
Bilirubin Urine: NEGATIVE
Ketones, ur: NEGATIVE mg/dL
Leukocytes, UA: NEGATIVE
Nitrite: NEGATIVE
Protein, ur: NEGATIVE mg/dL

## 2011-09-07 MED ORDER — ONDANSETRON 8 MG PO TBDP: 8.0000 mg | ORAL_TABLET | Freq: Once | ORAL | Status: DC

## 2011-09-07 MED ORDER — PROMETHAZINE HCL 25 MG PO TABS: 25.0000 mg | ORAL_TABLET | Freq: Four times a day (QID) | ORAL | Status: DC | PRN

## 2011-09-07 NOTE — MAU Provider Note (Signed)
History     CSN: [id number]  Arrival date and time: 09/07/11 1147   First Provider Initiated Contact with Patient 09/07/11 1317      Chief Complaint  Patient presents with  . Abdominal Pain   HPI Brianna Reeves 26 y.o. [redacted]w[redacted]d Comes to MAU with pain in "pelvic bones" and in low back.  Denies any vaginal discharge.  Denies vaginal bleeding.  No nausea or vomiting.  Has had some diarrhea x 3 days.  Has not tried any Tylenol.  Has Tylenol #3 at home but it does not help either.    OB History    Grav Para Term Preterm Abortions TAB SAB Ect Mult Living   4 3 2 1      3       Past Medical History  Diagnosis Date  . Anemia   . Potassium (K) deficiency   . Headache   . Pregnancy induced hypertension     all preg  . Gestational diabetes     all  . Urinary tract infection   . Depression     doing good    Past Surgical History  Procedure Date  . Cesarean section   . Tonsillectomy   . Cholecystectomy   . Cesarean section 10/12/2010    Procedure: CESAREAN SECTION;  Surgeon: Lazaro Arms, MD;  Location: [facility];  Service: Gynecology;  Laterality: N/A;  Repeat Cesarean Section with Delivery Baby Boy @ 1756; Apgars 7/9  . Cesarean section 10/12/2010    Procedure: CESAREAN SECTION;  Surgeon: Lazaro Arms, MD;  Location: [facility];  Service: Gynecology;  Laterality: N/A;    Family History  Problem Relation Age of Onset  . Hypertension Mother   . Diabetes Mother   . Hypertension Father   . Diabetes Father   . Hypertension Maternal Grandmother   . Diabetes Maternal Grandmother   . Other Neg Hx     History  Substance Use Topics  . Smoking status: Former Smoker    Types: Cigarettes  . Smokeless tobacco: Not on file   Comment: 2012  . Alcohol Use: No    Allergies:  Allergies  Allergen Reactions  . Vicodin (Hydrocodone-Acetaminophen) Shortness Of Breath and Swelling    No prescriptions prior to admission    Review of Systems  Constitutional:       Decreased  appetite  Gastrointestinal: Negative for heartburn, nausea, vomiting and abdominal pain.  Genitourinary: Negative for dysuria.       Pain in pelvis and low back   Physical Exam   Blood pressure 116/86, pulse 91, temperature 97.6 F (36.4 C), temperature source Oral, resp. rate 20, height 5\' 7"  (1.702 m), weight 238 lb (107.956 kg), last menstrual period 06/21/2011.  Physical Exam  Nursing note and vitals reviewed. Constitutional: She is oriented to person, place, and time. She appears well-developed and well-nourished.  HENT:  Head: Normocephalic.  Eyes: EOM are normal.  Neck: Neck supple.  GI: Soft. There is no tenderness.       Pain in low pubic symphysis radiating to both groins.    Musculoskeletal: Normal range of motion.       Low midline back pain   Neurological: She is alert and oriented to person, place, and time.  Skin: Skin is warm and dry.  Psychiatric: She has a normal mood and affect.    MAU Course  Procedures Results for orders placed during the hospital encounter of 09/07/11 (from the past 24 hour(s))  URINALYSIS, ROUTINE W  REFLEX MICROSCOPIC     Status: Abnormal   Collection Time   09/07/11 12:00 PM      Component Value Range   Color, Urine YELLOW  YELLOW   APPearance HAZY (*) CLEAR   Specific Gravity, Urine 1.025  1.005 - 1.030   pH 6.5  5.0 - 8.0   Glucose, UA NEGATIVE  NEGATIVE mg/dL   Hgb urine dipstick NEGATIVE  NEGATIVE   Bilirubin Urine NEGATIVE  NEGATIVE   Ketones, ur NEGATIVE  NEGATIVE mg/dL   Protein, ur NEGATIVE  NEGATIVE mg/dL   Urobilinogen, UA 1.0  0.0 - 1.0 mg/dL   Nitrite NEGATIVE  NEGATIVE   Leukocytes, UA NEGATIVE  NEGATIVE    MDM Client was asleep when provider entered room.  States she went to a concert last night.  Reports no problem other than pain in pelvic bones and low back.  Declines any offer of Tylenol.  Has not started prenatal care as she has not decided to keep this baby.  History of hypertension during pregnancy.  Strongly  advised to begin prenatal care ASAP.  Discussed aches and pains in pregnancy.  Assessment and Plan  Pelvic pain Low back pain in pregnancy  Plan May use a heating pad or ice pack to low back Take Tylenol 325 mg 2 tablets by mouth every 4 hours if needed for pain. Drink at least 8 8-oz glasses of water every day. No smoking, no drugs, no alcohol.  Take a prenatal vitamin one by mouth every day.  Eat small frequent snacks to avoid nausea.  Begin prenatal care as soon as possible.  Erbie Arment 09/07/2011, 1:22 PM

## 2011-09-07 NOTE — Discharge Instructions (Signed)
May use a heating pad or ice pack to low back Take Tylenol 325 mg 2 tablets by mouth every 4 hours if needed for pain. Drink at least 8 8-oz glasses of water every day. No smoking, no drugs, no alcohol.  Take a prenatal vitamin one by mouth every day.  Eat small frequent snacks to avoid nausea.  Begin prenatal care as soon as possible.

## 2011-09-07 NOTE — MAU Note (Addendum)
Lower pelvic pain for past wk, cramping in lower abd for past wk.  Ongoing nausea, throwing up a lot, didn't do that with other preg

## 2011-09-07 NOTE — MAU Provider Note (Signed)
Chart reviewed and agree with management and plan.  

## 2011-09-30 ENCOUNTER — Emergency Department (HOSPITAL_COMMUNITY): Payer: No Typology Code available for payment source

## 2011-09-30 ENCOUNTER — Encounter (HOSPITAL_COMMUNITY): Payer: Self-pay

## 2011-09-30 ENCOUNTER — Emergency Department (HOSPITAL_COMMUNITY)
Admission: EM | Admit: 2011-09-30 | Discharge: 2011-09-30 | Disposition: A | Discharge status: Home / Self Care | Payer: No Typology Code available for payment source | Attending: Emergency Medicine | Admitting: Emergency Medicine

## 2011-09-30 DIAGNOSIS — T1490XA Injury, unspecified, initial encounter: Secondary | ICD-10-CM | POA: Insufficient documentation

## 2011-09-30 DIAGNOSIS — Z331 Pregnant state, incidental: Secondary | ICD-10-CM | POA: Insufficient documentation

## 2011-09-30 DIAGNOSIS — Z3490 Encounter for supervision of normal pregnancy, unspecified, unspecified trimester: Secondary | ICD-10-CM

## 2011-09-30 DIAGNOSIS — R10819 Abdominal tenderness, unspecified site: Secondary | ICD-10-CM | POA: Insufficient documentation

## 2011-09-30 DIAGNOSIS — M25569 Pain in unspecified knee: Secondary | ICD-10-CM | POA: Insufficient documentation

## 2011-09-30 DIAGNOSIS — M542 Cervicalgia: Secondary | ICD-10-CM | POA: Insufficient documentation

## 2011-09-30 DIAGNOSIS — R109 Unspecified abdominal pain: Secondary | ICD-10-CM | POA: Insufficient documentation

## 2011-09-30 LAB — POCT I-STAT, CHEM 8
BUN: <3 mg/dL — ABNORMAL LOW (ref 6–23)
Calcium, Ion: 1.26 mmol/L — ABNORMAL HIGH (ref 1.12–1.23)
Calcium, Ion: 1.22 mmol/L (ref 1.12–1.23)
Chloride: 103 mEq/L (ref 96–112)
Glucose, Bld: 80 mg/dL (ref 70–99)
HCT: 34 % — ABNORMAL LOW (ref 36.0–46.0)
HCT: 34 % — ABNORMAL LOW (ref 36.0–46.0)
Hemoglobin: 11.6 g/dL — ABNORMAL LOW (ref 12.0–15.0)
Potassium: 3.4 mEq/L — ABNORMAL LOW (ref 3.5–5.1)
Sodium: 138 mEq/L (ref 135–145)
TCO2: 22 mmol/L (ref 0–100)

## 2011-09-30 LAB — CBC WITH DIFFERENTIAL/PLATELET
Basophils Absolute: 0 10*3/uL (ref 0.0–0.1)
Basophils Relative: 0 % (ref 0–1)
Eosinophils Absolute: 0.3 10*3/uL (ref 0.0–0.7)
MCH: 26.1 pg (ref 26.0–34.0)
MCHC: 33.8 g/dL (ref 30.0–36.0)
Monocytes Absolute: 0.5 10*3/uL (ref 0.1–1.0)
Monocytes Relative: 5 % (ref 3–12)
Neutro Abs: 5.5 10*3/uL (ref 1.7–7.7)
Neutrophils Relative %: 62 % (ref 43–77)
RDW: 16.2 % — ABNORMAL HIGH (ref 11.5–15.5)

## 2011-09-30 MED ORDER — OXYCODONE-ACETAMINOPHEN 5-325 MG PO TABS
1.0000 | ORAL_TABLET | Freq: Once | ORAL | Status: AC
  Administered 2011-09-30: 1 via ORAL
  Filled 2011-09-30: qty 1

## 2011-09-30 MED ORDER — OXYCODONE-ACETAMINOPHEN 5-325 MG PO TABS: 1.0000 | ORAL_TABLET | Freq: Four times a day (QID) | ORAL | Status: AC | PRN

## 2011-09-30 NOTE — ED Notes (Signed)
MD at bedside. 
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
## 2011-09-30 NOTE — ED Provider Notes (Signed)
Medical screening examination/treatment/procedure(s) were conducted as a shared visit with non-physician practitioner(s) and myself.  I personally evaluated the patient during the encounter   Gwyneth Sprout, MD 09/30/11 2322

## 2011-10-21 ENCOUNTER — Other Ambulatory Visit (HOSPITAL_COMMUNITY): Payer: Self-pay | Admitting: Physician Assistant

## 2011-10-21 DIAGNOSIS — Z3689 Encounter for other specified antenatal screening: Secondary | ICD-10-CM

## 2011-10-21 LAB — CYSTIC FIBROSIS DIAGNOSTIC STUDY: Interpretation-CFDNA:: NEGATIVE

## 2011-10-21 LAB — CULTURE, OB URINE: Urine Culture, OB: NEGATIVE

## 2011-10-21 LAB — OB RESULTS CONSOLE RPR: RPR: NONREACTIVE

## 2011-10-21 LAB — OB RESULTS CONSOLE HGB/HCT, BLOOD
HCT: 34 %
Hemoglobin: 11 g/dL

## 2011-10-21 LAB — OB RESULTS CONSOLE PLATELET COUNT: Platelets: 255 10*3/uL

## 2011-10-25 ENCOUNTER — Ambulatory Visit (HOSPITAL_COMMUNITY): Payer: No Typology Code available for payment source

## 2011-10-25 DIAGNOSIS — O0990 Supervision of high risk pregnancy, unspecified, unspecified trimester: Secondary | ICD-10-CM | POA: Insufficient documentation

## 2011-10-25 DIAGNOSIS — M955 Acquired deformity of pelvis: Secondary | ICD-10-CM | POA: Insufficient documentation

## 2011-10-25 DIAGNOSIS — Z98891 History of uterine scar from previous surgery: Secondary | ICD-10-CM | POA: Insufficient documentation

## 2011-10-25 DIAGNOSIS — O09899 Supervision of other high risk pregnancies, unspecified trimester: Secondary | ICD-10-CM

## 2011-10-25 DIAGNOSIS — Z8659 Personal history of other mental and behavioral disorders: Secondary | ICD-10-CM

## 2011-10-29 ENCOUNTER — Ambulatory Visit (HOSPITAL_COMMUNITY)
Admission: RE | Admit: 2011-10-29 | Discharge: 2011-10-29 | Disposition: A | Discharge status: Home / Self Care | Payer: Medicaid Other | Source: Ambulatory Visit | Attending: Physician Assistant | Admitting: Physician Assistant

## 2011-10-29 DIAGNOSIS — Z3689 Encounter for other specified antenatal screening: Secondary | ICD-10-CM

## 2011-10-29 DIAGNOSIS — O34219 Maternal care for unspecified type scar from previous cesarean delivery: Secondary | ICD-10-CM | POA: Insufficient documentation

## 2011-10-29 DIAGNOSIS — O10019 Pre-existing essential hypertension complicating pregnancy, unspecified trimester: Secondary | ICD-10-CM | POA: Insufficient documentation

## 2011-10-29 DIAGNOSIS — Z363 Encounter for antenatal screening for malformations: Secondary | ICD-10-CM | POA: Insufficient documentation

## 2011-10-29 DIAGNOSIS — Z1389 Encounter for screening for other disorder: Secondary | ICD-10-CM | POA: Insufficient documentation

## 2011-10-29 DIAGNOSIS — O358XX0 Maternal care for other (suspected) fetal abnormality and damage, not applicable or unspecified: Secondary | ICD-10-CM | POA: Insufficient documentation

## 2011-10-30 DIAGNOSIS — O0990 Supervision of high risk pregnancy, unspecified, unspecified trimester: Secondary | ICD-10-CM | POA: Insufficient documentation

## 2011-10-30 DIAGNOSIS — Z8659 Personal history of other mental and behavioral disorders: Secondary | ICD-10-CM

## 2011-10-30 DIAGNOSIS — M955 Acquired deformity of pelvis: Secondary | ICD-10-CM

## 2011-10-30 DIAGNOSIS — O09899 Supervision of other high risk pregnancies, unspecified trimester: Secondary | ICD-10-CM

## 2011-10-30 DIAGNOSIS — Z98891 History of uterine scar from previous surgery: Secondary | ICD-10-CM
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
## 2011-10-31 ENCOUNTER — Encounter: Payer: Medicaid Other | Admitting: Obstetrics & Gynecology

## 2011-11-02 ENCOUNTER — Emergency Department (HOSPITAL_COMMUNITY)
Admission: EM | Admit: 2011-11-02 | Discharge: 2011-11-02 | Discharge status: Against Medical Advice | Payer: Medicaid Other | Attending: Emergency Medicine | Admitting: Emergency Medicine

## 2011-11-02 DIAGNOSIS — M549 Dorsalgia, unspecified: Secondary | ICD-10-CM | POA: Insufficient documentation

## 2011-11-07 ENCOUNTER — Encounter: Payer: Medicaid Other | Admitting: Advanced Practice Midwife

## 2011-11-17 ENCOUNTER — Inpatient Hospital Stay (HOSPITAL_COMMUNITY)
Admission: AD | Admit: 2011-11-17 | Discharge: 2011-11-18 | Disposition: A | Discharge status: Home / Self Care | Payer: Self-pay | Source: Ambulatory Visit | Attending: Family Medicine | Admitting: Family Medicine

## 2011-11-17 ENCOUNTER — Encounter (HOSPITAL_COMMUNITY): Payer: Self-pay | Admitting: Obstetrics and Gynecology

## 2011-11-17 DIAGNOSIS — O99891 Other specified diseases and conditions complicating pregnancy: Secondary | ICD-10-CM | POA: Insufficient documentation

## 2011-11-17 DIAGNOSIS — Z98891 History of uterine scar from previous surgery: Secondary | ICD-10-CM

## 2011-11-17 DIAGNOSIS — O09899 Supervision of other high risk pregnancies, unspecified trimester: Secondary | ICD-10-CM

## 2011-11-17 DIAGNOSIS — N949 Unspecified condition associated with female genital organs and menstrual cycle: Secondary | ICD-10-CM | POA: Insufficient documentation

## 2011-11-17 DIAGNOSIS — M549 Dorsalgia, unspecified: Secondary | ICD-10-CM | POA: Insufficient documentation

## 2011-11-17 DIAGNOSIS — M955 Acquired deformity of pelvis: Secondary | ICD-10-CM

## 2011-11-17 DIAGNOSIS — Z8659 Personal history of other mental and behavioral disorders: Secondary | ICD-10-CM

## 2011-11-17 DIAGNOSIS — O0990 Supervision of high risk pregnancy, unspecified, unspecified trimester: Secondary | ICD-10-CM

## 2011-11-17 MED ORDER — CYCLOBENZAPRINE HCL 10 MG PO TABS
10.0000 mg | ORAL_TABLET | Freq: Once | ORAL | Status: AC
  Administered 2011-11-17: 10 mg via ORAL
  Filled 2011-11-17: qty 1

## 2011-11-17 MED ORDER — IBUPROFEN 600 MG PO TABS
600.0000 mg | ORAL_TABLET | Freq: Once | ORAL | Status: AC
  Administered 2011-11-17: 600 mg via ORAL
  Filled 2011-11-17: qty 1
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
## 2011-11-18 LAB — URINALYSIS, ROUTINE W REFLEX MICROSCOPIC
Bilirubin Urine: NEGATIVE
Leukocytes, UA: NEGATIVE
Nitrite: NEGATIVE
Specific Gravity, Urine: >1.03 — ABNORMAL HIGH (ref 1.005–1.030)
Urobilinogen, UA: 2 mg/dL — ABNORMAL HIGH (ref 0.0–1.0)

## 2011-11-18 MED ORDER — CYCLOBENZAPRINE HCL 10 MG PO TABS: 10.0000 mg | ORAL_TABLET | Freq: Three times a day (TID) | ORAL | Status: AC | PRN

## 2011-11-18 MED ORDER — OXYCODONE-ACETAMINOPHEN 5-325 MG PO TABS: 1.0000 | ORAL_TABLET | ORAL | Status: AC | PRN

## 2011-11-21 ENCOUNTER — Ambulatory Visit (INDEPENDENT_AMBULATORY_CARE_PROVIDER_SITE_OTHER): Payer: Self-pay | Admitting: Family Medicine

## 2011-11-21 ENCOUNTER — Encounter: Payer: Self-pay | Admitting: Family Medicine

## 2011-11-21 VITALS — BP 113/74 | Temp 97.4°F | Ht 66.0 in | Wt 238.9 lb

## 2011-11-21 DIAGNOSIS — Z98891 History of uterine scar from previous surgery: Secondary | ICD-10-CM

## 2011-11-21 DIAGNOSIS — O34219 Maternal care for unspecified type scar from previous cesarean delivery: Secondary | ICD-10-CM

## 2011-11-21 DIAGNOSIS — O0990 Supervision of high risk pregnancy, unspecified, unspecified trimester: Secondary | ICD-10-CM

## 2011-11-21 DIAGNOSIS — O10019 Pre-existing essential hypertension complicating pregnancy, unspecified trimester: Secondary | ICD-10-CM | POA: Insufficient documentation

## 2011-11-21 LAB — COMPREHENSIVE METABOLIC PANEL
AST: 15 U/L (ref 0–37)
Albumin: 3.3 g/dL — ABNORMAL LOW (ref 3.5–5.2)
Calcium: 9 mg/dL (ref 8.4–10.5)
Chloride: 103 mEq/L (ref 96–112)
Creat: 0.66 mg/dL (ref 0.50–1.10)
Sodium: 138 mEq/L (ref 135–145)
Total Bilirubin: 0.3 mg/dL (ref 0.3–1.2)
Total Protein: 6.2 g/dL (ref 6.0–8.3)

## 2011-11-21 LAB — POCT URINALYSIS DIP (DEVICE)
Leukocytes, UA: NEGATIVE
Nitrite: NEGATIVE
pH: 6.5 (ref 5.0–8.0)

## 2011-11-21 NOTE — Progress Notes (Signed)
U/S scheduled Sept. 10, 2013 at 315pm.

## 2011-11-21 NOTE — Progress Notes (Signed)
Pulse- 82  Pain/pressure- pelvic pain

## 2011-11-21 NOTE — Progress Notes (Signed)
Nutrition Note: 1st visit consult Pt here for hx of HTN. Pt also has hx of pre-eclampsia & GDM with previous pregnancies.  Pt has lost 1.1# @ [redacted]w[redacted]d. Pt reports eating 1-2 meals & 2 snacks/d. Pt stated she craves straight sugar & will eat it from the packets or sugar container.  Pt reports taking PNV daily & used to take iron but ran out 2wks ago. (Hgb:11 g/dL- wnl 03/23/08) Pt received verbal & written education on general diet during pregnancy & encouraged protein with meals & snacks. Also disc ways to stop eating straight sugar. Disc wt gain goals of 11-20#. Pt agrees to continue PNV & work on quitting eating raw sugar. Pt does receive WIC services. Pt stated she does not plan to BF but was receptive to education & agreed to try in hospital & then pump for baby. F/u if referred Blondell Reveal, MS, RD, LDN

## 2011-11-21 NOTE — Progress Notes (Signed)
New pt. Today h/o HTN since middle school.  H/o pre-eclampsia with each pregnancy. H/o 3 prior C-sections--unable to tie tubes last pregnancy secondary to scar tissue Baseline labs at next visit with 24 hour urine. Complete anatomy BP looks good today--no meds for now

## 2011-11-21 NOTE — Patient Instructions (Signed)
Pregnancy - Second Trimester The second trimester of pregnancy (3 to 6 months) is a period of rapid growth for you and your baby. At the end of the sixth month, your baby is about 9 inches long and weighs 1 1/2 pounds. You will begin to feel the baby move between 18 and 20 weeks of the pregnancy. This is called quickening. Weight gain is faster. A clear fluid (colostrum) may leak out of your breasts. You may feel small contractions of the womb (uterus). This is known as false labor or Braxton-Hicks contractions. This is like a practice for labor when the baby is ready to be born. Usually, the problems with morning sickness have usually passed by the end of your first trimester. Some women develop small dark blotches (called cholasma, mask of pregnancy) on their face that usually goes away after the baby is born. Exposure to the sun makes the blotches worse. Acne may also develop in some pregnant women and pregnant women who have acne, may find that it goes away. PRENATAL EXAMS  Blood work may continue to be done during prenatal exams. These tests are done to check on your health and the probable health of your baby. Blood work is used to follow your blood levels (hemoglobin). Anemia (low hemoglobin) is common during pregnancy. Iron and vitamins are given to help prevent this. You will also be checked for diabetes between 24 and 28 weeks of the pregnancy. Some of the previous blood tests may be repeated.   The size of the uterus is measured during each visit. This is to make sure that the baby is continuing to grow properly according to the dates of the pregnancy.   Your blood pressure is checked every prenatal visit. This is to make sure you are not getting toxemia.   Your urine is checked to make sure you do not have an infection, diabetes or protein in the urine.   Your weight is checked often to make sure gains are happening at the suggested rate. This is to ensure that both you and your baby are  growing normally.   Sometimes, an ultrasound is performed to confirm the proper growth and development of the baby. This is a test which bounces harmless sound waves off the baby so your caregiver can more accurately determine due dates.  Sometimes, a specialized test is done on the amniotic fluid surrounding the baby. This test is called an amniocentesis. The amniotic fluid is obtained by sticking a needle into the belly (abdomen). This is done to check the chromosomes in instances where there is a concern about possible genetic problems with the baby. It is also sometimes done near the end of pregnancy if an early delivery is required. In this case, it is done to help make sure the baby's lungs are mature enough for the baby to live outside of the womb. CHANGES OCCURING IN THE SECOND TRIMESTER OF PREGNANCY Your body goes through many changes during pregnancy. They vary from person to person. Talk to your caregiver about changes you notice that you are concerned about.  During the second trimester, you will likely have an increase in your appetite. It is normal to have cravings for certain foods. This varies from person to person and pregnancy to pregnancy.   Your lower abdomen will begin to bulge.   You may have to urinate more often because the uterus and baby are pressing on your bladder. It is also common to get more bladder infections during pregnancy (  pain with urination). You can help this by drinking lots of fluids and emptying your bladder before and after intercourse.   You may begin to get stretch marks on your hips, abdomen, and breasts. These are normal changes in the body during pregnancy. There are no exercises or medications to take that prevent this change.   You may begin to develop swollen and bulging veins (varicose veins) in your legs. Wearing support hose, elevating your feet for 15 minutes, 3 to 4 times a day and limiting salt in your diet helps lessen the problem.    Heartburn may develop as the uterus grows and pushes up against the stomach. Antacids recommended by your caregiver helps with this problem. Also, eating smaller meals 4 to 5 times a day helps.   Constipation can be treated with a stool softener or adding bulk to your diet. Drinking lots of fluids, vegetables, fruits, and whole grains are helpful.   Exercising is also helpful. If you have been very active up until your pregnancy, most of these activities can be continued during your pregnancy. If you have been less active, it is helpful to start an exercise program such as walking.   Hemorrhoids (varicose veins in the rectum) may develop at the end of the second trimester. Warm sitz baths and hemorrhoid cream recommended by your caregiver helps hemorrhoid problems.   Backaches may develop during this time of your pregnancy. Avoid heavy lifting, wear low heal shoes and practice good posture to help with backache problems.   Some pregnant women develop tingling and numbness of their hand and fingers because of swelling and tightening of ligaments in the wrist (carpel tunnel syndrome). This goes away after the baby is born.   As your breasts enlarge, you may have to get a bigger bra. Get a comfortable, cotton, support bra. Do not get a nursing bra until the last month of the pregnancy if you will be nursing the baby.   You may get a dark line from your belly button to the pubic area called the linea nigra.   You may develop rosy cheeks because of increase blood flow to the face.   You may develop spider looking lines of the face, neck, arms and chest. These go away after the baby is born.  HOME CARE INSTRUCTIONS   It is extremely important to avoid all smoking, herbs, alcohol, and unprescribed drugs during your pregnancy. These chemicals affect the formation and growth of the baby. Avoid these chemicals throughout the pregnancy to ensure the delivery of a healthy infant.   Most of your home  care instructions are the same as suggested for the first trimester of your pregnancy. Keep your caregiver's appointments. Follow your caregiver's instructions regarding medication use, exercise and diet.   During pregnancy, you are providing food for you and your baby. Continue to eat regular, well-balanced meals. Choose foods such as meat, fish, milk and other low fat dairy products, vegetables, fruits, and whole-grain breads and cereals. Your caregiver will tell you of the ideal weight gain.   A physical sexual relationship may be continued up until near the end of pregnancy if there are no other problems. Problems could include early (premature) leaking of amniotic fluid from the membranes, vaginal bleeding, abdominal pain, or other medical or pregnancy problems.   Exercise regularly if there are no restrictions. Check with your caregiver if you are unsure of the safety of some of your exercises. The greatest weight gain will occur in the   last 2 trimesters of pregnancy. Exercise will help you:   Control your weight.   Get you in shape for labor and delivery.   Lose weight after you have the baby.   Wear a good support or jogging bra for breast tenderness during pregnancy. This may help if worn during sleep. Pads or tissues may be used in the bra if you are leaking colostrum.   Do not use hot tubs, steam rooms or saunas throughout the pregnancy.   Wear your seat belt at all times when driving. This protects you and your baby if you are in an accident.   Avoid raw meat, uncooked cheese, cat litter boxes and soil used by cats. These carry germs that can cause birth defects in the baby.   The second trimester is also a good time to visit your dentist for your dental health if this has not been done yet. Getting your teeth cleaned is OK. Use a soft toothbrush. Brush gently during pregnancy.   It is easier to loose urine during pregnancy. Tightening up and strengthening the pelvic muscles will  help with this problem. Practice stopping your urination while you are going to the bathroom. These are the same muscles you need to strengthen. It is also the muscles you would use as if you were trying to stop from passing gas. You can practice tightening these muscles up 10 times a set and repeating this about 3 times per day. Once you know what muscles to tighten up, do not perform these exercises during urination. It is more likely to contribute to an infection by backing up the urine.   Ask for help if you have financial, counseling or nutritional needs during pregnancy. Your caregiver will be able to offer counseling for these needs as well as refer you for other special needs.   Your skin may become oily. If so, wash your face with mild soap, use non-greasy moisturizer and oil or cream based makeup.  MEDICATIONS AND DRUG USE IN PREGNANCY  Take prenatal vitamins as directed. The vitamin should contain 1 milligram of folic acid. Keep all vitamins out of reach of children. Only a couple vitamins or tablets containing iron may be fatal to a baby or young child when ingested.   Avoid use of all medications, including herbs, over-the-counter medications, not prescribed or suggested by your caregiver. Only take over-the-counter or prescription medicines for pain, discomfort, or fever as directed by your caregiver. Do not use aspirin.   Let your caregiver also know about herbs you may be using.   Alcohol is related to a number of birth defects. This includes fetal alcohol syndrome. All alcohol, in any form, should be avoided completely. Smoking will cause low birth rate and premature babies.   Street or illegal drugs are very harmful to the baby. They are absolutely forbidden. A baby born to an addicted mother will be addicted at birth. The baby will go through the same withdrawal an adult does.  SEEK MEDICAL CARE IF:  You have any concerns or worries during your pregnancy. It is better to call with  your questions if you feel they cannot wait, rather than worry about them. SEEK IMMEDIATE MEDICAL CARE IF:   An unexplained oral temperature above 102 F (38.9 C) develops, or as your caregiver suggests.   You have leaking of fluid from the vagina (birth canal). If leaking membranes are suspected, take your temperature and tell your caregiver of this when you call.   There   is vaginal spotting, bleeding, or passing clots. Tell your caregiver of the amount and how many pads are used. Light spotting in pregnancy is common, especially following intercourse.   You develop a bad smelling vaginal discharge with a change in the color from clear to white.   You continue to feel sick to your stomach (nauseated) and have no relief from remedies suggested. You vomit blood or coffee ground-like materials.   You lose more than 2 pounds of weight or gain more than 2 pounds of weight over 1 week, or as suggested by your caregiver.   You notice swelling of your face, hands, feet, or legs.   You get exposed to German measles and have never had them.   You are exposed to fifth disease or chickenpox.   You develop belly (abdominal) pain. Round ligament discomfort is a common non-cancerous (benign) cause of abdominal pain in pregnancy. Your caregiver still must evaluate you.   You develop a bad headache that does not go away.   You develop fever, diarrhea, pain with urination, or shortness of breath.   You develop visual problems, blurry, or double vision.   You fall or are in a car accident or any kind of trauma.   There is mental or physical violence at home.  Document Released: 02/26/2001 Document Revised: 02/21/2011 Document Reviewed: 08/31/2008 [employer] Patient Information 2012 [employer]. Contraception Choices Contraception (birth control) is the use of any methods or devices to prevent pregnancy. Below are some methods to help avoid pregnancy. HORMONAL METHODS   Contraceptive implant.  This is a thin, plastic tube containing progesterone hormone. It does not contain estrogen hormone. Your caregiver inserts the tube in the inner part of the upper arm. The tube can remain in place for up to 3 years. After 3 years, the implant must be removed. The implant prevents the ovaries from releasing an egg (ovulation), thickens the cervical mucus which prevents sperm from entering the uterus, and thins the lining of the inside of the uterus.   Progesterone-only injections. These injections are given every 3 months by your caregiver to prevent pregnancy. This synthetic progesterone hormone stops the ovaries from releasing eggs. It also thickens cervical mucus and changes the uterine lining. This makes it harder for sperm to survive in the uterus.   Birth control pills. These pills contain estrogen and progesterone hormone. They work by stopping the egg from forming in the ovary (ovulation). Birth control pills are prescribed by a caregiver.Birth control pills can also be used to treat heavy periods.   Minipill. This type of birth control pill contains only the progesterone hormone. They are taken every day of each month and must be prescribed by your caregiver.   Birth control patch. The patch contains hormones similar to those in birth control pills. It must be changed once a week and is prescribed by a caregiver.   Vaginal ring. The ring contains hormones similar to those in birth control pills. It is left in the vagina for 3 weeks, removed for 1 week, and then a new one is put back in place. The patient must be comfortable inserting and removing the ring from the vagina.A caregiver's prescription is necessary.   Emergency contraception. Emergency contraceptives prevent pregnancy after unprotected sexual intercourse. This pill can be taken right after sex or up to 5 days after unprotected sex. It is most effective the sooner you take the pills after having sexual intercourse. Emergency  contraceptive pills are available without a prescription.   Check with your pharmacist. Do not use emergency contraception as your only form of birth control.  BARRIER METHODS   Female condom. This is a thin sheath (latex or rubber) that is worn over the penis during sexual intercourse. It can be used with spermicide to increase effectiveness.   Female condom. This is a soft, loose-fitting sheath that is put into the vagina before sexual intercourse.   Diaphragm. This is a soft, latex, dome-shaped barrier that must be fitted by a caregiver. It is inserted into the vagina, along with a spermicidal jelly. It is inserted before intercourse. The diaphragm should be left in the vagina for 6 to 8 hours after intercourse.   Cervical cap. This is a round, soft, latex or plastic cup that fits over the cervix and must be fitted by a caregiver. The cap can be left in place for up to 48 hours after intercourse.   Sponge. This is a soft, circular piece of polyurethane foam. The sponge has spermicide in it. It is inserted into the vagina after wetting it and before sexual intercourse.   Spermicides. These are chemicals that kill or block sperm from entering the cervix and uterus. They come in the form of creams, jellies, suppositories, foam, or tablets. They do not require a prescription. They are inserted into the vagina with an applicator before having sexual intercourse. The process must be repeated every time you have sexual intercourse.  INTRAUTERINE CONTRACEPTION  Intrauterine device (IUD). This is a T-shaped device that is put in a woman's uterus during a menstrual period to prevent pregnancy. There are 2 types:   Copper IUD. This type of IUD is wrapped in copper wire and is placed inside the uterus. Copper makes the uterus and fallopian tubes produce a fluid that kills sperm. It can stay in place for 10 years.   Hormone IUD. This type of IUD contains the hormone progestin (synthetic progesterone). The  hormone thickens the cervical mucus and prevents sperm from entering the uterus, and it also thins the uterine lining to prevent implantation of a fertilized egg. The hormone can weaken or kill the sperm that get into the uterus. It can stay in place for 5 years.  PERMANENT METHODS OF CONTRACEPTION  Female tubal ligation. This is when the woman's fallopian tubes are surgically sealed, tied, or blocked to prevent the egg from traveling to the uterus.   Female sterilization. This is when the female has the tubes that carry sperm tied off (vasectomy).This blocks sperm from entering the vagina during sexual intercourse. After the procedure, the man can still ejaculate fluid (semen).  NATURAL PLANNING METHODS  Natural family planning. This is not having sexual intercourse or using a barrier method (condom, diaphragm, cervical cap) on days the woman could become pregnant.   Calendar method. This is keeping track of the length of each menstrual cycle and identifying when you are fertile.   Ovulation method. This is avoiding sexual intercourse during ovulation.   Symptothermal method. This is avoiding sexual intercourse during ovulation, using a thermometer and ovulation symptoms.   Post-ovulation method. This is timing sexual intercourse after you have ovulated.  Regardless of which type or method of contraception you choose, it is important that you use condoms to protect against the transmission of sexually transmitted diseases (STDs). Talk with your caregiver about which form of contraception is most appropriate for you. Document Released: 03/04/2005 Document Revised: 02/21/2011 Document Reviewed: 07/11/2010 [employer] Patient Information 2012 [employer]. Breastfeeding BENEFITS OF BREASTFEEDING For   the baby  The first milk (colostrum) helps the baby's digestive system function better.   There are antibodies from the mother in the milk that help the baby fight off infections.   The baby has a  lower incidence of asthma, allergies, and SIDS (sudden infant death syndrome).   The nutrients in breast milk are better than formulas for the baby and helps the baby's brain grow better.   Babies who breastfeed have less gas, colic, and constipation.  For the mother  Breastfeeding helps develop a very special bond between mother and baby.   It is more convenient, always available at the correct temperature and cheaper than formula feeding.   It burns calories in the mother and helps with losing weight that was gained during pregnancy.   It makes the uterus contract back down to normal size faster and slows bleeding following delivery.   Breastfeeding mothers have a lower risk of developing breast cancer.  NURSE FREQUENTLY  A healthy, full-term baby may breastfeed as often as every hour or space his or her feedings to every 3 hours.   How often to nurse will vary from baby to baby. Watch your baby for signs of hunger, not the clock.   Nurse as often as the baby requests, or when you feel the need to reduce the fullness of your breasts.   Awaken the baby if it has been 3 to 4 hours since the last feeding.   Frequent feeding will help the mother make more milk and will prevent problems like sore nipples and engorgement of the breasts.  BABY'S POSITION AT THE BREAST  Whether lying down or sitting, be sure that the baby's tummy is facing your tummy.   Support the breast with 4 fingers underneath the breast and the thumb above. Make sure your fingers are well away from the nipple and baby's mouth.   Stroke the baby's lips and cheek closest to the breast gently with your finger or nipple.   When the baby's mouth is open wide enough, place all of your nipple and as much of the dark area around the nipple as possible into your baby's mouth.   Pull the baby in close so the tip of the nose and the baby's cheeks touch the breast during the feeding.  FEEDINGS  The length of each feeding  varies from baby to baby and from feeding to feeding.   The baby must suck about 2 to 3 minutes for your milk to get to him or her. This is called a "let down." For this reason, allow the baby to feed on each breast as long as he or she wants. Your baby will end the feeding when he or she has received the right balance of nutrients.   To break the suction, put your finger into the corner of the baby's mouth and slide it between his or her gums before removing your breast from his or her mouth. This will help prevent sore nipples.  REDUCING BREAST ENGORGEMENT  In the first week after your baby is born, you may experience signs of breast engorgement. When breasts are engorged, they feel heavy, warm, full, and may be tender to the touch. You can reduce engorgement if you:   Nurse frequently, every 2 to 3 hours. Mothers who breastfeed early and often have fewer problems with engorgement.   Place light ice packs on your breasts between feedings. This reduces swelling. Wrap the ice packs in a lightweight towel to protect   your skin.   Apply moist hot packs to your breast for 5 to 10 minutes before each feeding. This increases circulation and helps the milk flow.   Gently massage your breast before and during the feeding.   Make sure that the baby empties at least one breast at every feeding before switching sides.   Use a breast pump to empty the breasts if your baby is sleepy or not nursing well. You may also want to pump if you are returning to work or or you feel you are getting engorged.   Avoid bottle feeds, pacifiers or supplemental feedings of water or juice in place of breastfeeding.   Be sure the baby is latched on and positioned properly while breastfeeding.   Prevent fatigue, stress, and anemia.   Wear a supportive bra, avoiding underwire styles.   Eat a balanced diet with enough fluids.  If you follow these suggestions, your engorgement should improve in 24 to 48 hours. If you are  still experiencing difficulty, call your lactation consultant or caregiver. IS MY BABY GETTING ENOUGH MILK? Sometimes, mothers worry about whether their babies are getting enough milk. You can be assured that your baby is getting enough milk if:  The baby is actively sucking and you hear swallowing.   The baby nurses at least 8 to 12 times in a 24 hour time period. Nurse your baby until he or she unlatches or falls asleep at the first breast (at least 10 to 20 minutes), then offer the second side.   The baby is wetting 5 to 6 disposable diapers (6 to 8 cloth diapers) in a 24 hour period by 5 to 6 days of age.   The baby is having at least 2 to 3 stools every 24 hours for the first few months. Breast milk is all the food your baby needs. It is not necessary for your baby to have water or formula. In fact, to help your breasts make more milk, it is best not to give your baby supplemental feedings during the early weeks.   The stool should be soft and yellow.   The baby should gain 4 to 7 ounces per week after he is 4 days old.  TAKE CARE OF YOURSELF Take care of your breasts by:  Bathing or showering daily.   Avoiding the use of soaps on your nipples.   Start feedings on your left breast at one feeding and on your right breast at the next feeding.   You will notice an increase in your milk supply 2 to 5 days after delivery. You may feel some discomfort from engorgement, which makes your breasts very firm and often tender. Engorgement "peaks" out within 24 to 48 hours. In the meantime, apply warm moist towels to your breasts for 5 to 10 minutes before feeding. Gentle massage and expression of some milk before feeding will soften your breasts, making it easier for your baby to latch on. Wear a well fitting nursing bra and air dry your nipples for 10 to 15 minutes after each feeding.   Only use cotton bra pads.   Only use pure lanolin on your nipples after nursing. You do not need to wash it  off before nursing.  Take care of yourself by:   Eating well-balanced meals and nutritious snacks.   Drinking milk, fruit juice, and water to satisfy your thirst (about 8 glasses a day).   Getting plenty of rest.   Increasing calcium in your diet (1200 mg   a day).   Avoiding foods that you notice affect the baby in a bad way.  SEEK MEDICAL CARE IF:   You have any questions or difficulty with breastfeeding.   You need help.   You have a hard, red, sore area on your breast, accompanied by a fever of 100.5 F (38.1 C) or more.   Your baby is too sleepy to eat well or is having trouble sleeping.   Your baby is wetting less than 6 diapers per day, by 5 days of age.   Your baby's skin or white part of his or her eyes is more yellow than it was in the hospital.   You feel depressed.  Document Released: 03/04/2005 Document Revised: 02/21/2011 Document Reviewed: 10/17/2008 ExitCare Patient Information 2012 [employer]. 

## 2011-11-25 NOTE — MAU Provider Note (Signed)
Chart reviewed and agree with management and plan.  

## 2011-11-26 ENCOUNTER — Ambulatory Visit (HOSPITAL_COMMUNITY)
Admission: RE | Admit: 2011-11-26 | Discharge: 2011-11-26 | Disposition: A | Discharge status: Home / Self Care | Payer: Self-pay | Source: Ambulatory Visit | Attending: Family Medicine | Admitting: Family Medicine

## 2011-11-26 DIAGNOSIS — O34219 Maternal care for unspecified type scar from previous cesarean delivery: Secondary | ICD-10-CM | POA: Insufficient documentation

## 2011-11-26 DIAGNOSIS — O10019 Pre-existing essential hypertension complicating pregnancy, unspecified trimester: Secondary | ICD-10-CM | POA: Insufficient documentation

## 2011-12-12 ENCOUNTER — Ambulatory Visit (INDEPENDENT_AMBULATORY_CARE_PROVIDER_SITE_OTHER): Payer: Self-pay | Admitting: Obstetrics and Gynecology

## 2011-12-12 ENCOUNTER — Encounter: Payer: Self-pay | Admitting: Obstetrics and Gynecology

## 2011-12-12 VITALS — BP 134/74 | Temp 97.1°F | Wt 245.9 lb

## 2011-12-12 DIAGNOSIS — O09299 Supervision of pregnancy with other poor reproductive or obstetric history, unspecified trimester: Secondary | ICD-10-CM

## 2011-12-12 DIAGNOSIS — O4692 Antepartum hemorrhage, unspecified, second trimester: Secondary | ICD-10-CM

## 2011-12-12 DIAGNOSIS — O10019 Pre-existing essential hypertension complicating pregnancy, unspecified trimester: Secondary | ICD-10-CM

## 2011-12-12 DIAGNOSIS — Z8632 Personal history of gestational diabetes: Secondary | ICD-10-CM

## 2011-12-12 DIAGNOSIS — O0990 Supervision of high risk pregnancy, unspecified, unspecified trimester: Secondary | ICD-10-CM

## 2011-12-12 DIAGNOSIS — Z23 Encounter for immunization: Secondary | ICD-10-CM

## 2011-12-12 LAB — POCT URINALYSIS DIP (DEVICE)
Glucose, UA: NEGATIVE mg/dL
Hgb urine dipstick: NEGATIVE
Ketones, ur: NEGATIVE mg/dL
Leukocytes, UA: NEGATIVE
Nitrite: NEGATIVE
Protein, ur: 30 mg/dL — AB
Specific Gravity, Urine: 1.025 (ref 1.005–1.030)
Urobilinogen, UA: 1 mg/dL (ref 0.0–1.0)
pH: 6.5 (ref 5.0–8.0)

## 2011-12-12 MED ORDER — INFLUENZA VIRUS VACC SPLIT PF IM SUSP: 0.5000 mL | Freq: Once | INTRAMUSCULAR | Status: DC

## 2011-12-12 NOTE — Progress Notes (Signed)
Pulse 100 Patient reports a lot of pain and pressure in pelvis/vagina. Reports spotting on Monday & Tuesday and has been having nausea/vomiting since Monday & unable to keep anything down Patient states she needs a refill on her muscle spasm medication & iron

## 2011-12-12 NOTE — Progress Notes (Signed)
Discussed BPs and diet (no sweets) and will need another 1 hr GTT. Has not done 24 hr pro. - states will bring before next visit. States she had red-orange blood vaginal spotting earlier this week and this was not postcoital. She did have vaginal pain the prior week and was seen at [facility]. Spec exam: Physiologic discharge, no blood; cervix long and closed. Advised to come to maternity admissions whenever she has bleeding. WP done.

## 2011-12-13 LAB — WET PREP, GENITAL: Trich, Wet Prep: NONE SEEN

## 2011-12-17 ENCOUNTER — Encounter: Payer: Self-pay | Admitting: Obstetrics & Gynecology

## 2011-12-17 ENCOUNTER — Telehealth: Payer: Self-pay | Admitting: *Deleted

## 2011-12-17 NOTE — Telephone Encounter (Signed)
Called pt regarding her message in My Chart wherein she described having pelvic pain, feeling like her pelvic bones are rubbing together and having difficulty sleeping because of the pain.  I stated that we will need to see her for evaluation and offered her appt tomorrow @ 1045. Pt agreed to appt and voiced understanding.

## 2011-12-18 ENCOUNTER — Encounter: Payer: Self-pay | Admitting: Obstetrics and Gynecology

## 2011-12-24 ENCOUNTER — Inpatient Hospital Stay (HOSPITAL_COMMUNITY)
Admission: AD | Admit: 2011-12-24 | Discharge: 2011-12-24 | Disposition: A | Discharge status: Home / Self Care | Payer: Medicaid Other | Source: Ambulatory Visit | Attending: Obstetrics & Gynecology | Admitting: Obstetrics & Gynecology

## 2011-12-24 ENCOUNTER — Encounter (HOSPITAL_COMMUNITY): Payer: Self-pay | Admitting: *Deleted

## 2011-12-24 DIAGNOSIS — O99891 Other specified diseases and conditions complicating pregnancy: Secondary | ICD-10-CM | POA: Insufficient documentation

## 2011-12-24 DIAGNOSIS — N949 Unspecified condition associated with female genital organs and menstrual cycle: Secondary | ICD-10-CM | POA: Insufficient documentation

## 2011-12-24 DIAGNOSIS — M25559 Pain in unspecified hip: Secondary | ICD-10-CM

## 2011-12-24 LAB — URINALYSIS, ROUTINE W REFLEX MICROSCOPIC
Bilirubin Urine: NEGATIVE
Hgb urine dipstick: NEGATIVE
Ketones, ur: NEGATIVE mg/dL
Nitrite: NEGATIVE
Protein, ur: NEGATIVE mg/dL
Urobilinogen, UA: 1 mg/dL (ref 0.0–1.0)

## 2011-12-24 MED ORDER — CYCLOBENZAPRINE HCL 10 MG PO TABS: 10.0000 mg | ORAL_TABLET | Freq: Three times a day (TID) | ORAL | Status: DC | PRN

## 2011-12-24 MED ORDER — TRACTION PELVIC BELT MISC: 1.0000 [IU] | Freq: Once | Status: DC

## 2011-12-24 MED ORDER — IBUPROFEN 200 MG PO TABS: 200.0000 mg | ORAL_TABLET | Freq: Four times a day (QID) | ORAL | Status: DC | PRN

## 2011-12-24 NOTE — [facility]U Provider Note (Signed)
Chief Complaint:  Pelvic Pain  HPI: Brianna Reeves is a 26 y.o. [phone] at 24w4dwho presents to maternity admissions reporting pelvic pain.  Denies contractions, leakage of fluid or vaginal bleeding. Good fetal movement.   Onset: 1 month Palliation, Provocation: laying on side or flat on back.  Sitting up straight in chair alleviates Quality: sharp, grinding Radiation: none Scale: 9-10  Pregnancy Course:  [facility].  Recently had appt last week, but could not make appt due to transportation.  Has follow up scheduled for 10/17.  Has not had any cervical activity in past 24 hours.  Past Medical History: Past Medical History  Diagnosis Date  . Anemia   . Potassium (K) deficiency   . Headache   . Pregnancy induced hypertension     all preg  . Gestational diabetes     all  . Urinary tract infection   . Depression     doing good    Past obstetric history: OB History    Grav Para Term Preterm Abortions TAB SAB Ect Mult Living   4 3 3  0      3     # Outc Date GA Lbr Len/2nd Wgt Sex Del Anes PTL Lv   1 TRM 5/08 [redacted]w[redacted]d  7lb6oz(3.345kg) F LTCS   Yes   Comments: PIH   2 TRM 7/11 [redacted]w[redacted]d  6lb9oz(2.977kg) F LTCS Spinal No Yes   Comments: PIH; schedueld c/s, hemorrhage   3 TRM 7/12 [redacted]w[redacted]d 00:00 7lb7.9oz(3.4kg) M CS-Vac   Yes   Comments: PIH- 'emergency c/s', GDM.  Per pt in ICU x 3 days   4 CUR               Past Surgical History: Past Surgical History  Procedure Date  . Cesarean section   . Tonsillectomy   . Cholecystectomy   . Cesarean section 10/12/2010    Procedure: CESAREAN SECTION;  Surgeon: Lazaro Arms, MD;  Location: [facility];  Service: Gynecology;  Laterality: N/A;  Repeat Cesarean Section with Delivery Baby Boy @ 1756; Apgars 7/9  . Cesarean section 10/12/2010    Procedure: CESAREAN SECTION;  Surgeon: Lazaro Arms, MD;  Location: [facility];  Service: Gynecology;  Laterality: N/A;    Family History: Family History  Problem Relation  Age of Onset  . Hypertension Mother   . Diabetes Mother   . Hypertension Father   . Diabetes Father   . Hypertension Maternal Grandmother   . Diabetes Maternal Grandmother   . Other Neg Hx   . Early death Sister     Social History: History  Substance Use Topics  . Smoking status: Former Smoker    Types: Cigarettes  . Smokeless tobacco: Never Used   Comment: 2012  . Alcohol Use: No    Allergies:  Allergies  Allergen Reactions  . Vicodin (Hydrocodone-Acetaminophen) Shortness Of Breath and Swelling    Meds:  Prescriptions prior to admission  Medication Sig Dispense Refill  . Prenatal Vit-Fe Fumarate-FA (PRENATAL MULTIVITAMIN) TABS Take 1 tablet by mouth daily.        ROS: Pertinent findings in history of present illness.  Physical Exam  Blood pressure 106/73, pulse 94, temperature 98.4 F (36.9 C), temperature source Oral, resp. rate 20, last menstrual period 06/21/2011, unknown if currently breastfeeding. GENERAL: Well-developed, well-nourished female in no acute distress.  HEENT: normocephalic HEART: normal rate RESP: normal effort ABDOMEN: Soft, non-tender, gravid appropriate for gestational age EXTREMITIES: Nontender, no edema NEURO: alert  and oriented CERVICAL: Closed, anterior     Labs: Results for orders placed during the hospital encounter of 12/24/11 (from the past 24 hour(s))  URINALYSIS, ROUTINE W REFLEX MICROSCOPIC     Status: Abnormal   Collection Time   12/24/11  1:01 PM      Component Value Range   Color, Urine ORANGE (*) YELLOW   APPearance CLOUDY (*) CLEAR   Specific Gravity, Urine 1.025  1.005 - 1.030   pH 6.5  5.0 - 8.0   Glucose, UA NEGATIVE  NEGATIVE mg/dL   Hgb urine dipstick NEGATIVE  NEGATIVE   Bilirubin Urine NEGATIVE  NEGATIVE   Ketones, ur NEGATIVE  NEGATIVE mg/dL   Protein, ur NEGATIVE  NEGATIVE mg/dL   Urobilinogen, UA 1.0  0.0 - 1.0 mg/dL   Nitrite NEGATIVE  NEGATIVE   Leukocytes, UA NEGATIVE  NEGATIVE    Imaging:  US Ob  Follow Up  11/26/2011  OBSTETRICAL ULTRASOUND: This exam was performed within a [facility]. The OB US report was generated in the [facility] system, and faxed to the ordering physician.   This report is also available in [employer] and in the [employer]. See [facility] Obstetric US report.   ED Course   Assessment: [id number]@[redacted]w[redacted]d  presenting with pelvic pain\  #Pubic diastasis - recommended brace - Ibuprofen, advised against using in third trimester - counseled patient regarding positional changes to assist with pain, baths, etc. - Flexeril - f/u with PCP 10/17 - Discharge to home     Medication List     As of 12/24/2011  2:52 PM    ASK your doctor about these medications         prenatal multivitamin Tabs   Take 1 tablet by mouth daily.        Sonia Side, MD Family Medicine 12/24/2011 2:52 PM  I was present for the exam and agree with above.  [place], [place] 12/24/2011 5:31 PM

## 2011-12-24 NOTE — MAU Note (Signed)
C/O severe pain in "pubic bone" for several months, but getting much worse. Points to lower suprapubic area. Hurts to walk, cannot sleep.

## 2012-01-02 ENCOUNTER — Ambulatory Visit (INDEPENDENT_AMBULATORY_CARE_PROVIDER_SITE_OTHER): Payer: Self-pay | Admitting: Obstetrics & Gynecology

## 2012-01-02 VITALS — BP 124/78 | Temp 97.1°F | Wt 247.7 lb

## 2012-01-02 DIAGNOSIS — O09299 Supervision of pregnancy with other poor reproductive or obstetric history, unspecified trimester: Secondary | ICD-10-CM

## 2012-01-02 DIAGNOSIS — O169 Unspecified maternal hypertension, unspecified trimester: Secondary | ICD-10-CM

## 2012-01-02 DIAGNOSIS — R55 Syncope and collapse: Secondary | ICD-10-CM

## 2012-01-02 DIAGNOSIS — Z23 Encounter for immunization: Secondary | ICD-10-CM

## 2012-01-02 LAB — POCT URINALYSIS DIP (DEVICE)
Hgb urine dipstick: NEGATIVE
Leukocytes, UA: NEGATIVE
Nitrite: NEGATIVE
Protein, ur: 30 mg/dL — AB
pH: 6.5 (ref 5.0–8.0)

## 2012-01-02 LAB — CBC
MCH: 24.7 pg — ABNORMAL LOW (ref 26.0–34.0)
MCHC: 32.8 g/dL (ref 30.0–36.0)
MCV: 75.5 fL — ABNORMAL LOW (ref 78.0–100.0)
Platelets: 261 10*3/uL (ref 150–400)
RBC: 3.88 MIL/uL (ref 3.87–5.11)

## 2012-01-02 MED ORDER — INFLUENZA VIRUS VACC SPLIT PF IM SUSP
0.5000 mL | Freq: Once | INTRAMUSCULAR | Status: AC
  Administered 2012-01-02: 0.5 mL via INTRAMUSCULAR

## 2012-01-02 NOTE — Progress Notes (Signed)
U/S scheduled 01/06/12 at 330pm. Cardiology appointment scheduled with Dr. Antoine Poche at [facility] on 01/03/12 at 1pm.

## 2012-01-02 NOTE — Patient Instructions (Signed)
Breastfeeding Deciding to breastfeed is one of the best choices you can make for you and your baby. The information that follows gives a brief overview of the benefits of breastfeeding as well as common topics surrounding breastfeeding. BENEFITS OF BREASTFEEDING For the baby  The first milk (colostrum) helps the baby's digestive system function better.   There are antibodies in the mother's milk that help the baby fight off infections.   The baby has a lower incidence of asthma, allergies, and sudden infant death syndrome (SIDS).   The nutrients in breast milk are better for the baby than infant formulas, and breast milk helps the baby's brain grow better.   Babies who breastfeed have less gas, colic, and constipation.  For the mother  Breastfeeding helps develop a very special bond between the mother and her baby.   Breastfeeding is convenient, always available at the correct temperature, and costs nothing.   Breastfeeding burns calories in the mother and helps her lose weight that was gained during pregnancy.   Breastfeeding makes the uterus contract back down to normal size faster and slows bleeding following delivery.   Breastfeeding mothers have a lower risk of developing breast cancer.  BREASTFEEDING FREQUENCY  A healthy, full-term baby may breastfeed as often as every hour or space his or her feedings to every 3 hours.   Watch your baby for signs of hunger. Nurse your baby if he or she shows signs of hunger. How often you nurse will vary from baby to baby.   Nurse as often as the baby requests, or when you feel the need to reduce the fullness of your breasts.   Awaken the baby if it has been 3 4 hours since the last feeding.   Frequent feeding will help the mother make more milk and will help prevent problems, such as sore nipples and engorgement of the breasts.  BABY'S POSITION AT THE BREAST  Whether lying down or sitting, be sure that the baby's tummy is  facing your tummy.   Support the breast with 4 fingers underneath the breast and the thumb above. Make sure your fingers are well away from the nipple and baby's mouth.   Stroke the baby's lips gently with your finger or nipple.   When the baby's mouth is open wide enough, place all of your nipple and as much of the areola as possible into your baby's mouth.   Pull the baby in close so the tip of the nose and the baby's cheeks touch the breast during the feeding.  FEEDINGS AND SUCTION  The length of each feeding varies from baby to baby and from feeding to feeding.   The baby must suck about 2 3 minutes for your milk to get to him or her. This is called a "let down." For this reason, allow the baby to feed on each breast as long as he or she wants. Your baby will end the feeding when he or she has received the right balance of nutrients.   To break the suction, put your finger into the corner of the baby's mouth and slide it between his or her gums before removing your breast from his or her mouth. This will help prevent sore nipples.  HOW TO TELL WHETHER YOUR BABY IS GETTING ENOUGH BREAST MILK. Wondering whether or not your baby is getting enough milk is a common concern among mothers. You can be assured that your baby is getting enough milk if:   Your baby is actively   sucking and you hear swallowing.   Your baby seems relaxed and satisfied after a feeding.   Your baby nurses at least 8 12 times in a 24 hour time period. Nurse your baby until he or she unlatches or falls asleep at the first breast (at least 10 20 minutes), then offer the second side.   Your baby is wetting 5 6 disposable diapers (6 8 cloth diapers) in a 24 hour period by 5 6 days of age.   Your baby is having at least 3 4 stools every 24 hours for the first 6 weeks. The stool should be soft and yellow.   Your baby should gain 4 7 ounces per week after he or she is 4 days old.   Your breasts feel softer  after nursing.  REDUCING BREAST ENGORGEMENT  In the first week after your baby is born, you may experience signs of breast engorgement. When breasts are engorged, they feel heavy, warm, full, and may be tender to the touch. You can reduce engorgement if you:   Nurse frequently, every 2 3 hours. Mothers who breastfeed early and often have fewer problems with engorgement.   Place light ice packs on your breasts for 10 20 minutes between feedings. This reduces swelling. Wrap the ice packs in a lightweight towel to protect your skin. Bags of frozen vegetables work well for this purpose.   Take a warm shower or apply warm, moist heat to your breast for 5 10 minutes just before each feeding. This increases circulation and helps the milk flow.   Gently massage your breast before and during the feeding. Using your finger tips, massage from the chest wall towards your nipple in a circular motion.   Make sure that the baby empties at least one breast at every feeding before switching sides.   Use a breast pump to empty the breasts if your baby is sleepy or not nursing well. You may also want to pump if you are returning to work oryou feel you are getting engorged.   Avoid bottle feeds, pacifiers, or supplemental feedings of water or juice in place of breastfeeding. Breast milk is all the food your baby needs. It is not necessary for your baby to have water or formula. In fact, to help your breasts make more milk, it is best not to give your baby supplemental feedings during the early weeks.   Be sure the baby is latched on and positioned properly while breastfeeding.   Wear a supportive bra, avoiding underwire styles.   Eat a balanced diet with enough fluids.   Rest often, relax, and take your prenatal vitamins to prevent fatigue, stress, and anemia.  If you follow these suggestions, your engorgement should improve in 24 48 hours. If you are still experiencing difficulty, call your  lactation consultant or caregiver.  CARING FOR YOURSELF Take care of your breasts  Bathe or shower daily.   Avoid using soap on your nipples.   Start feedings on your left breast at one feeding and on your right breast at the next feeding.   You will notice an increase in your milk supply 2 5 days after delivery. You may feel some discomfort from engorgement, which makes your breasts very firm and often tender. Engorgement "peaks" out within 24 48 hours. In the meantime, apply warm moist towels to your breasts for 5 10 minutes before feeding. Gentle massage and expression of some milk before feeding will soften your breasts, making it easier for your   baby to latch on.   Wear a well-fitting nursing bra, and air dry your nipples for a 3 4minutes after each feeding.   Only use cotton bra pads.   Only use pure lanolin on your nipples after nursing. You do not need to wash it off before feeding the baby again. Another option is to express a few drops of breast milk and gently massage it into your nipples.  Take care of yourself  Eat well-balanced meals and nutritious snacks.   Drinking milk, fruit juice, and water to satisfy your thirst (about 8 glasses a day).   Get plenty of rest.  Avoid foods that you notice affect the baby in a bad way.  SEEK MEDICAL CARE IF:   You have difficulty with breastfeeding and need help.   You have a hard, red, sore area on your breast that is accompanied by a fever.   Your baby is too sleepy to eat well or is having trouble sleeping.   Your baby is wetting less than 6 diapers a day, by 5 days of age.   Your baby's skin or white part of his or her eyes is more yellow than it was in the hospital.   You feel depressed.  Document Released: 03/04/2005 Document Revised: 09/03/2011 Document Reviewed: 06/02/2011 Exit[employer] Patient Information 2013 [employer].  

## 2012-01-02 NOTE — Progress Notes (Signed)
P=108. States did not get pregnancy belt because [facility] did not have it and she can't find it anywhere. C/o edema in feet only.c/o pain when baby moves. Desires flu shot today.

## 2012-01-02 NOTE — Progress Notes (Signed)
Pt has had 2 syncopal episodes in past 1 1/2 weeks.  Will get cardiology work up.  GCT today.  BP nml.  Needs [place] for growth which is ordered.

## 2012-01-03 ENCOUNTER — Institutional Professional Consult (permissible substitution): Payer: Self-pay | Admitting: Cardiology

## 2012-01-03 LAB — RPR

## 2012-01-06 ENCOUNTER — Other Ambulatory Visit: Payer: Self-pay | Admitting: Obstetrics & Gynecology

## 2012-01-06 ENCOUNTER — Ambulatory Visit (HOSPITAL_COMMUNITY)
Admission: RE | Admit: 2012-01-06 | Discharge: 2012-01-06 | Disposition: A | Discharge status: Home / Self Care | Payer: Medicaid Other | Source: Ambulatory Visit | Attending: Obstetrics & Gynecology | Admitting: Obstetrics & Gynecology

## 2012-01-06 ENCOUNTER — Encounter: Payer: Self-pay | Admitting: Obstetrics & Gynecology

## 2012-01-06 DIAGNOSIS — O99019 Anemia complicating pregnancy, unspecified trimester: Secondary | ICD-10-CM | POA: Insufficient documentation

## 2012-01-06 DIAGNOSIS — O169 Unspecified maternal hypertension, unspecified trimester: Secondary | ICD-10-CM

## 2012-01-06 DIAGNOSIS — O34219 Maternal care for unspecified type scar from previous cesarean delivery: Secondary | ICD-10-CM | POA: Insufficient documentation

## 2012-01-06 DIAGNOSIS — O10019 Pre-existing essential hypertension complicating pregnancy, unspecified trimester: Secondary | ICD-10-CM | POA: Insufficient documentation

## 2012-01-06 DIAGNOSIS — D649 Anemia, unspecified: Secondary | ICD-10-CM

## 2012-01-07 ENCOUNTER — Telehealth: Payer: Self-pay | Admitting: Medical

## 2012-01-07 NOTE — Telephone Encounter (Signed)
Message copied by Freddi Starr on Tue Jan 07, 2012  9:23 AM ------      Message from: Lesly Dukes      Created: Mon Jan 06, 2012  7:43 PM       Needs 3 hr.   Please schedule per protocol.

## 2012-01-07 NOTE — Telephone Encounter (Signed)
Called patient and informed her of abnormal 1 hour GTT. Patient will come Friday at 8:00 am for 3 hour GTT. The patient agrees and did not have any further questions.

## 2012-01-08 ENCOUNTER — Other Ambulatory Visit: Payer: Self-pay

## 2012-01-08 DIAGNOSIS — D649 Anemia, unspecified: Secondary | ICD-10-CM

## 2012-01-08 DIAGNOSIS — R7309 Other abnormal glucose: Secondary | ICD-10-CM

## 2012-01-08 LAB — FERRITIN: Ferritin: 5 ng/mL — ABNORMAL LOW (ref 10–291)

## 2012-01-09 ENCOUNTER — Other Ambulatory Visit: Payer: Self-pay | Admitting: Obstetrics & Gynecology

## 2012-01-09 ENCOUNTER — Other Ambulatory Visit: Payer: Self-pay

## 2012-01-09 ENCOUNTER — Encounter: Payer: Self-pay | Admitting: Obstetrics & Gynecology

## 2012-01-09 LAB — GLUCOSE TOLERANCE, 3 HOURS
Glucose Tolerance, 1 hour: 152 mg/dL (ref 70–189)
Glucose Tolerance, 2 hour: 130 mg/dL (ref 70–164)
Glucose Tolerance, Fasting: 97 mg/dL (ref 70–104)

## 2012-01-09 MED ORDER — FERROUS SULFATE 325 (65 FE) MG PO TABS: 325.0000 mg | ORAL_TABLET | Freq: Every day | ORAL | Status: DC

## 2012-01-09 MED ORDER — DOCUSATE SODIUM 100 MG PO CAPS: 100.0000 mg | ORAL_CAPSULE | Freq: Two times a day (BID) | ORAL | Status: DC

## 2012-01-10 ENCOUNTER — Other Ambulatory Visit: Payer: Self-pay

## 2012-01-10 NOTE — Progress Notes (Signed)
Called pt and left message that Dr. Penne Lash has sent 2 prescriptions to her pharmacy based on test results. Please call back to the nurse voice mail if she would like additional information. She may state whether or not we may leave all information on her voice mail.  **Note:  Pt needs to be informed that her iron level is low and she requires a supplement. Also she will need stool softener (Colace) as ordered to guard against constipation.

## 2012-01-13 NOTE — Progress Notes (Signed)
Brianna Reeves called and left a message wanting to know what medicines are waiting for her at pharmacy and what they are for.

## 2012-01-13 NOTE — Progress Notes (Signed)
Called Brianna Reeves and left a message we are returning her call and we were closed over the weekend- please call back if you would like [place] to answer your question  And you may leave a message if there is a voicemail that you would like [place] to leave a detailed message.

## 2012-01-14 NOTE — Progress Notes (Signed)
LM for patient to return call to clinic with questions.

## 2012-01-16 ENCOUNTER — Ambulatory Visit (INDEPENDENT_AMBULATORY_CARE_PROVIDER_SITE_OTHER): Payer: Self-pay | Admitting: Family Medicine

## 2012-01-16 VITALS — BP 118/60 | Temp 97.4°F | Wt 243.9 lb

## 2012-01-16 DIAGNOSIS — O10019 Pre-existing essential hypertension complicating pregnancy, unspecified trimester: Secondary | ICD-10-CM

## 2012-01-16 DIAGNOSIS — D649 Anemia, unspecified: Secondary | ICD-10-CM

## 2012-01-16 DIAGNOSIS — O0990 Supervision of high risk pregnancy, unspecified, unspecified trimester: Secondary | ICD-10-CM

## 2012-01-16 DIAGNOSIS — O99019 Anemia complicating pregnancy, unspecified trimester: Secondary | ICD-10-CM

## 2012-01-16 LAB — POCT URINALYSIS DIP (DEVICE)
Ketones, ur: NEGATIVE mg/dL
Leukocytes, UA: NEGATIVE
Protein, ur: 100 mg/dL — AB
Specific Gravity, Urine: 1.025 (ref 1.005–1.030)

## 2012-01-16 MED ORDER — LORATADINE 10 MG PO TABS: 10.0000 mg | ORAL_TABLET | Freq: Every day | ORAL | Status: DC

## 2012-01-16 MED ORDER — FERROUS SULFATE 325 (65 FE) MG PO TABS: 325.0000 mg | ORAL_TABLET | Freq: Every day | ORAL | Status: DC

## 2012-01-16 MED ORDER — DIPHENHYDRAMINE-ZINC ACETATE 1-0.1 % EX CREA: TOPICAL_CREAM | Freq: Three times a day (TID) | CUTANEOUS | Status: DC | PRN

## 2012-01-16 MED ORDER — DIPHENHYDRAMINE HCL 25 MG PO CAPS: 25.0000 mg | ORAL_CAPSULE | Freq: Every evening | ORAL | Status: DC | PRN

## 2012-01-16 NOTE — Patient Instructions (Signed)
Pregnancy - Third Trimester  The third trimester of pregnancy (the last 3 months) is a period of the most rapid growth for you and your baby. The baby approaches a length of 20 inches and a weight of 6 to 10 pounds. The baby is adding on fat and getting ready for life outside your body. While inside, babies have periods of sleeping and waking, suck their thumbs, and hiccups. You can often feel small contractions of the uterus. This is false labor. It is also called Braxton-Hicks contractions. This is like a practice for labor. The usual problems in this stage of pregnancy include more difficulty breathing, swelling of the hands and feet from water retention, and having to urinate more often because of the uterus and baby pressing on your bladder.   PRENATAL EXAMS  · Blood work may continue to be done during prenatal exams. These tests are done to check on your health and the probable health of your baby. Blood work is used to follow your blood levels (hemoglobin). Anemia (low hemoglobin) is common during pregnancy. Iron and vitamins are given to help prevent this. You may also continue to be checked for diabetes. Some of the past blood tests may be done again.  · The size of the uterus is measured during each visit. This makes sure your baby is growing properly according to your pregnancy dates.  · Your blood pressure is checked every prenatal visit. This is to make sure you are not getting toxemia.  · Your urine is checked every prenatal visit for infection, diabetes and protein.  · Your weight is checked at each visit. This is done to make sure gains are happening at the suggested rate and that you and your baby are growing normally.  · Sometimes, an ultrasound is performed to confirm the position and the proper growth and development of the baby. This is a test done that bounces harmless sound waves off the baby so your caregiver can more accurately determine due dates.  · Discuss the type of pain medication and  anesthesia you will have during your labor and delivery.  · Discuss the possibility and anesthesia if a Cesarean Section might be necessary.  · Inform your caregiver if there is any mental or physical violence at home.  Sometimes, a specialized non-stress test, contraction stress test and biophysical profile are done to make sure the baby is not having a problem. Checking the amniotic fluid surrounding the baby is called an amniocentesis. The amniotic fluid is removed by sticking a needle into the belly (abdomen). This is sometimes done near the end of pregnancy if an early delivery is required. In this case, it is done to help make sure the baby's lungs are mature enough for the baby to live outside of the womb. If the lungs are not mature and it is unsafe to deliver the baby, an injection of cortisone medication is given to the mother 1 to 2 days before the delivery. This helps the baby's lungs mature and makes it safer to deliver the baby.  CHANGES OCCURING IN THE THIRD TRIMESTER OF PREGNANCY  Your body goes through many changes during pregnancy. They vary from person to person. Talk to your caregiver about changes you notice and are concerned about.  · During the last trimester, you have probably had an increase in your appetite. It is normal to have cravings for certain foods. This varies from person to person and pregnancy to pregnancy.  · You may begin to   get stretch marks on your hips, abdomen, and breasts. These are normal changes in the body during pregnancy. There are no exercises or medications to take which prevent this change.  · Constipation may be treated with a stool softener or adding bulk to your diet. Drinking lots of fluids, fiber in vegetables, fruits, and whole grains are helpful.  · Exercising is also helpful. If you have been very active up until your pregnancy, most of these activities can be continued during your pregnancy. If you have been less active, it is helpful to start an exercise  program such as walking. Consult your caregiver before starting exercise programs.  · Avoid all smoking, alcohol, un-prescribed drugs, herbs and "street drugs" during your pregnancy. These chemicals affect the formation and growth of the baby. Avoid chemicals throughout the pregnancy to ensure the delivery of a healthy infant.  · Backache, varicose veins and hemorrhoids may develop or get worse.  · You will tire more easily in the third trimester, which is normal.  · The baby's movements may be stronger and more often.  · You may become short of breath easily.  · Your belly button may stick out.  · A yellow discharge may leak from your breasts called colostrum.  · You may have a bloody mucus discharge. This usually occurs a few days to a week before labor begins.  HOME CARE INSTRUCTIONS   · Keep your caregiver's appointments. Follow your caregiver's instructions regarding medication use, exercise, and diet.  · During pregnancy, you are providing food for you and your baby. Continue to eat regular, well-balanced meals. Choose foods such as meat, fish, milk and other low fat dairy products, vegetables, fruits, and whole-grain breads and cereals. Your caregiver will tell you of the ideal weight gain.  · A physical sexual relationship may be continued throughout pregnancy if there are no other problems such as early (premature) leaking of amniotic fluid from the membranes, vaginal bleeding, or belly (abdominal) pain.  · Exercise regularly if there are no restrictions. Check with your caregiver if you are unsure of the safety of your exercises. Greater weight gain will occur in the last 2 trimesters of pregnancy. Exercising helps:  · Control your weight.  · Get you in shape for labor and delivery.  · You lose weight after you deliver.  · Rest a lot with legs elevated, or as needed for leg cramps or low back pain.  · Wear a good support or jogging bra for breast tenderness during pregnancy. This may help if worn during  sleep. Pads or tissues may be used in the bra if you are leaking colostrum.  · Do not use hot tubs, steam rooms, or saunas.  · Wear your seat belt when driving. This protects you and your baby if you are in an accident.  · Avoid raw meat, cat litter boxes and soil used by cats. These carry germs that can cause birth defects in the baby.  · It is easier to loose urine during pregnancy. Tightening up and strengthening the pelvic muscles will help with this problem. You can practice stopping your urination while you are going to the bathroom. These are the same muscles you need to strengthen. It is also the muscles you would use if you were trying to stop from passing gas. You can practice tightening these muscles up 10 times a set and repeating this about 3 times per day. Once you know what muscles to tighten up, do not perform these   exercises during urination. It is more likely to cause an infection by backing up the urine.  · Ask for help if you have financial, counseling or nutritional needs during pregnancy. Your caregiver will be able to offer counseling for these needs as well as refer you for other special needs.  · Make a list of emergency phone numbers and have them available.  · Plan on getting help from family or friends when you go home from the hospital.  · Make a trial run to the hospital.  · Take prenatal classes with the father to understand, practice and ask questions about the labor and delivery.  · Prepare the baby's room/nursery.  · Do not travel out of the city unless it is absolutely necessary and with the advice of your caregiver.  · Wear only low or no heal shoes to have better balance and prevent falling.  MEDICATIONS AND DRUG USE IN PREGNANCY  · Take prenatal vitamins as directed. The vitamin should contain 1 milligram of folic acid. Keep all vitamins out of reach of children. Only a couple vitamins or tablets containing iron may be fatal to a baby or young child when ingested.  · Avoid use  of all medications, including herbs, over-the-counter medications, not prescribed or suggested by your caregiver. Only take over-the-counter or prescription medicines for pain, discomfort, or fever as directed by your caregiver. Do not use aspirin, ibuprofen (Motrin®, Advil®, Nuprin®) or naproxen (Aleve®) unless OK'd by your caregiver.  · Let your caregiver also know about herbs you may be using.  · Alcohol is related to a number of birth defects. This includes fetal alcohol syndrome. All alcohol, in any form, should be avoided completely. Smoking will cause low birth rate and premature babies.  · Street/illegal drugs are very harmful to the baby. They are absolutely forbidden. A baby born to an addicted mother will be addicted at birth. The baby will go through the same withdrawal an adult does.  SEEK MEDICAL CARE IF:  You have any concerns or worries during your pregnancy. It is better to call with your questions if you feel they cannot wait, rather than worry about them.  DECISIONS ABOUT CIRCUMCISION  You may or may not know the sex of your baby. If you know your baby is a boy, it may be time to think about circumcision. Circumcision is the removal of the foreskin of the penis. This is the skin that covers the sensitive end of the penis. There is no proven medical need for this. Often this decision is made on what is popular at the time or based upon religious beliefs and social issues. You can discuss these issues with your caregiver or pediatrician.  SEEK IMMEDIATE MEDICAL CARE IF:   · An unexplained oral temperature above 102° F (38.9° C) develops, or as your caregiver suggests.  · You have leaking of fluid from the vagina (birth canal). If leaking membranes are suspected, take your temperature and tell your caregiver of this when you call.  · There is vaginal spotting, bleeding or passing clots. Tell your caregiver of the amount and how many pads are used.  · You develop a bad smelling vaginal discharge with  a change in the color from clear to white.  · You develop vomiting that lasts more than 24 hours.  · You develop chills or fever.  · You develop shortness of breath.  · You develop burning on urination.  · You loose more than 2 pounds of weight   or gain more than 2 pounds of weight or as suggested by your caregiver.  · You notice sudden swelling of your face, hands, and feet or legs.  · You develop belly (abdominal) pain. Round ligament discomfort is a common non-cancerous (benign) cause of abdominal pain in pregnancy. Your caregiver still must evaluate you.  · You develop a severe headache that does not go away.  · You develop visual problems, blurred or double vision.  · If you have not felt your baby move for more than 1 hour. If you think the baby is not moving as much as usual, eat something with sugar in it and lie down on your left side for an hour. The baby should move at least 4 to 5 times per hour. Call right away if your baby moves less than that.  · You fall, are in a car accident or any kind of trauma.  · There is mental or physical violence at home.  Document Released: 02/26/2001 Document Revised: 05/27/2011 Document Reviewed: 08/31/2008  [employer]® Patient Information ©2013 [employer].

## 2012-01-16 NOTE — Progress Notes (Signed)
Pulse: 97 Pt states that she went to pharmacy to pick up her iron and colace and the pharmacist told her they didn't get a prescription.

## 2012-01-16 NOTE — Progress Notes (Signed)
Still having fainting spells. Had one yesterday. Has cardiology appt tomorrow. No taking FeSO4. Cramping for about 10 minutes on and off. No vaginal discharge or dysuria. No bleeding or loss of fluid. Headaches daily, seeing polka dots since early pregnancy. Resolve spontaneously.  BP normal today. Turned in a 24-hour urine last week but clinic was closed and left at lab. Results not available. Will re-collect and get CMP.  Pt also itching daily for past week, mostly on trunk. No visible rash but signs of excoriation. Will check bile acids (with labs collected after 24 hour urine protein). Proteinuria 100 today. Loratadine/benadryl and benadryl cream.  3 hour GTT normal.

## 2012-01-20 ENCOUNTER — Telehealth: Payer: Self-pay

## 2012-01-20 NOTE — Telephone Encounter (Addendum)
Julie,nurse at Dr. Westley Hummer office, is advised that we could not see pt. this am as she is not established with [place] yet b/c she canceled her 10/18 appt. and that she is scheduled to see Dr. Antoine Poche on 11/13. Raynelle Fanning verbalized understanding.

## 2012-01-20 NOTE — Telephone Encounter (Addendum)
Pt. walked into office this am stating that she was advised by Dr. Westley Hummer office to come to this office now concerning her syncope. Pt. was scheduled to see Dr. Antoine Poche for consult on 10/18 but canceled, appt was rescheduled for 11/13. She states that she had syncopal episode X 2 yesterday. Pt. Is in 3rd trimester with her 4th pregnancy and states that she had same "fainting spells" with all pregnancies. We attempted to schedule a sooner appt. with [place] but were unable, 11/13 is the soonest appt. we have available. Pt. Is advised to call 911 if s/s continue or worsen, she verbalized understanding.  Message left on VM at Dr. Westley Hummer office to call this office.

## 2012-01-20 NOTE — Addendum Note (Signed)
Addended by: Franchot Mimes on: 01/20/2012 10:40 AM   Modules accepted: Orders

## 2012-01-21 LAB — CREATININE CLEARANCE, URINE, 24 HOUR
Creatinine, 24H Ur: 1319 mg/d (ref 700–1800)
Creatinine, Urine: 277.7 mg/dL
Creatinine: 0.47 mg/dL — ABNORMAL LOW (ref 0.50–1.10)

## 2012-01-21 LAB — COMPREHENSIVE METABOLIC PANEL
AST: 13 U/L (ref 0–37)
Alkaline Phosphatase: 163 U/L — ABNORMAL HIGH (ref 39–117)
BUN: 4 mg/dL — ABNORMAL LOW (ref 6–23)
Creat: 0.47 mg/dL — ABNORMAL LOW (ref 0.50–1.10)
Glucose, Bld: 85 mg/dL (ref 70–99)
Total Bilirubin: 0.5 mg/dL (ref 0.3–1.2)

## 2012-01-21 LAB — PROTEIN, URINE, 24 HOUR: Protein, Urine: 22 mg/dL

## 2012-01-22 ENCOUNTER — Telehealth: Payer: Self-pay | Admitting: *Deleted

## 2012-01-22 ENCOUNTER — Encounter: Payer: Self-pay | Admitting: Family Medicine

## 2012-01-22 ENCOUNTER — Encounter (HOSPITAL_COMMUNITY): Payer: Self-pay

## 2012-01-22 ENCOUNTER — Inpatient Hospital Stay (HOSPITAL_COMMUNITY): Payer: Medicaid Other

## 2012-01-22 ENCOUNTER — Inpatient Hospital Stay (HOSPITAL_COMMUNITY)
Admission: AD | Admit: 2012-01-22 | Discharge: 2012-01-23 | Disposition: A | Discharge status: Home / Self Care | Payer: Medicaid Other | Source: Ambulatory Visit | Attending: Obstetrics and Gynecology | Admitting: Obstetrics and Gynecology

## 2012-01-22 DIAGNOSIS — W108XXA Fall (on) (from) other stairs and steps, initial encounter: Secondary | ICD-10-CM | POA: Insufficient documentation

## 2012-01-22 DIAGNOSIS — W19XXXA Unspecified fall, initial encounter: Secondary | ICD-10-CM

## 2012-01-22 DIAGNOSIS — D649 Anemia, unspecified: Secondary | ICD-10-CM

## 2012-01-22 DIAGNOSIS — E876 Hypokalemia: Secondary | ICD-10-CM

## 2012-01-22 DIAGNOSIS — O99891 Other specified diseases and conditions complicating pregnancy: Secondary | ICD-10-CM | POA: Insufficient documentation

## 2012-01-22 LAB — CBC WITH DIFFERENTIAL/PLATELET
HCT: 28.8 % — ABNORMAL LOW (ref 36.0–46.0)
Hemoglobin: 9.2 g/dL — ABNORMAL LOW (ref 12.0–15.0)
Lymphocytes Relative: 31 % (ref 12–46)
Lymphs Abs: 2.7 10*3/uL (ref 0.7–4.0)
Monocytes Absolute: 0.6 10*3/uL (ref 0.1–1.0)
Monocytes Relative: 7 % (ref 3–12)
Neutro Abs: 5.1 10*3/uL (ref 1.7–7.7)
Neutrophils Relative %: 59 % (ref 43–77)
RBC: 3.78 MIL/uL — ABNORMAL LOW (ref 3.87–5.11)

## 2012-01-22 LAB — COMPREHENSIVE METABOLIC PANEL
AST: 14 U/L (ref 0–37)
CO2: 22 mEq/L (ref 19–32)
Calcium: 8.8 mg/dL (ref 8.4–10.5)
Creatinine, Ser: 0.49 mg/dL — ABNORMAL LOW (ref 0.50–1.10)
GFR calc Af Amer: >90 mL/min (ref >90)
GFR calc non Af Amer: >90 mL/min (ref >90)

## 2012-01-22 LAB — PREPARE RBC (CROSSMATCH)

## 2012-01-22 LAB — URINALYSIS, ROUTINE W REFLEX MICROSCOPIC
Glucose, UA: NEGATIVE mg/dL
Leukocytes, UA: NEGATIVE
Nitrite: NEGATIVE
Specific Gravity, Urine: 1.015 (ref 1.005–1.030)
pH: 7 (ref 5.0–8.0)

## 2012-01-22 LAB — PROTEIN / CREATININE RATIO, URINE
Protein Creatinine Ratio: 0.09 (ref 0.00–0.15)
Total Protein, Urine: 10 mg/dL

## 2012-01-22 LAB — RAPID URINE DRUG SCREEN, HOSP PERFORMED
Amphetamines: NOT DETECTED
Barbiturates: NOT DETECTED
Benzodiazepines: NOT DETECTED
Cocaine: NOT DETECTED
Tetrahydrocannabinol: NOT DETECTED

## 2012-01-22 LAB — FIBRINOGEN: Fibrinogen: 490 mg/dL — ABNORMAL HIGH (ref 204–475)

## 2012-01-22 MED ORDER — INTEGRA F 125-1 MG PO CAPS: 1.0000 | ORAL_CAPSULE | Freq: Every day | ORAL | Status: DC

## 2012-01-22 MED ORDER — POTASSIUM CHLORIDE CRYS ER 20 MEQ PO TBCR: 40.0000 meq | EXTENDED_RELEASE_TABLET | Freq: Every day | ORAL | Status: DC

## 2012-01-22 MED ORDER — BUTALBITAL-APAP-CAFFEINE 50-325-40 MG PO TABS: 1.0000 | ORAL_TABLET | Freq: Once | ORAL | Status: DC

## 2012-01-22 MED ORDER — POTASSIUM CHLORIDE CRYS ER 20 MEQ PO TBCR
40.0000 meq | EXTENDED_RELEASE_TABLET | ORAL | Status: AC
  Administered 2012-01-22: 40 meq via ORAL
  Filled 2012-01-22: qty 2

## 2012-01-22 MED ORDER — BUTALBITAL-APAP-CAFFEINE 50-325-40 MG PO TABS
1.0000 | ORAL_TABLET | Freq: Once | ORAL | Status: AC
  Administered 2012-01-22: 1 via ORAL
  Filled 2012-01-22: qty 1

## 2012-01-22 NOTE — [facility] Provider Note (Signed)
History     CSN: [id number]  Arrival date and time: 01/22/12 1800   First Provider Initiated Contact with Patient 01/22/12 1858    Brianna Reeves 26 y.o.  [id number].[redacted]w[redacted]d   Chief Complaint  Patient presents with  . Fall   HPI Patient states she fell today about noon. She was walking down the stairs and felt hot, sweaty, dizzy, saw "polka dots" and then she woke up on the floor at the bottom of the three stairs. Her mother witnessed the fall, but is unsure if she fell on her stomach or hit her head. She has a headache she describes of a frontal throbbing headache that involves bilateral eye pain. She is experiencing shortness of breath. She is feeling "pelvic pain", which she states has been constant for a month and half. She has felt her baby move. Denies leaking, gush of fluid or vaginal bleeding. She has had two c-sections prior, with a classical c-section for emergencies d/t her hypertension. She denies epigastric pain. She failed her 3 hour GTT, but has not been medicated.  Past Medical History  Diagnosis Date  . Anemia   . Potassium (K) deficiency   . Headache   . Pregnancy induced hypertension     all preg  . Gestational diabetes     all  . Urinary tract infection   . Depression     doing good    Past Surgical History  Procedure Date  . Cesarean section   . Tonsillectomy   . Cholecystectomy   . Cesarean section 10/12/2010    Procedure: CESAREAN SECTION;  Surgeon: Lazaro Arms, MD;  Location: [facility];  Service: Gynecology;  Laterality: N/A;  Repeat Cesarean Section with Delivery Baby Boy @ 1756; Apgars 7/9  . Cesarean section 10/12/2010    Procedure: CESAREAN SECTION;  Surgeon: Lazaro Arms, MD;  Location: [facility];  Service: Gynecology;  Laterality: N/A;    Family History  Problem Relation Age of Onset  . Hypertension Mother   . Diabetes Mother   . Hypertension Father   . Diabetes Father   . Hypertension Maternal Grandmother   . Diabetes Maternal Grandmother   .  Other Neg Hx   . Early death Sister     History  Substance Use Topics  . Smoking status: Former Smoker    Types: Cigarettes  . Smokeless tobacco: Never Used     Comment: 2012  . Alcohol Use: No    Allergies:  Allergies  Allergen Reactions  . Vicodin (Hydrocodone-Acetaminophen) Shortness Of Breath and Swelling    Prescriptions prior to admission  Medication Sig Dispense Refill  . cyclobenzaprine (FLEXERIL) 10 MG tablet Take 1 tablet (10 mg total) by mouth 3 (three) times daily as needed for muscle spasms.  30 tablet  0  . diphenhydrAMINE (BENADRYL) 25 mg capsule Take 1 capsule (25 mg total) by mouth at bedtime as needed for itching.  30 capsule  0  . diphenhydrAMINE-zinc acetate (BENADRYL) cream Apply topically 3 (three) times daily as needed for itching.  28.3 g  0  . docusate sodium (COLACE) 100 MG capsule Take 1 capsule (100 mg total) by mouth 2 (two) times daily.  60 capsule  2  . ferrous sulfate 325 (65 FE) MG tablet Take 1 tablet (325 mg total) by mouth daily.  30 tablet  3  . loratadine (CLARITIN) 10 MG tablet Take 1 tablet (10 mg total) by mouth daily.  30 tablet  3  . Misc. Devices (TRACTION  PELVIC BELT) MISC 1 Units by Does not apply route once.  1 each  0  . Prenatal Vit-Fe Fumarate-FA (PRENATAL MULTIVITAMIN) TABS Take 1 tablet by mouth daily.        Review of Systems  Constitutional: Positive for malaise/fatigue and diaphoresis. Negative for fever and chills.  Eyes: Negative for blurred vision, double vision and photophobia.  Respiratory: Positive for shortness of breath.   Cardiovascular: Negative for chest pain, palpitations and leg swelling.  Gastrointestinal: Positive for abdominal pain. Negative for heartburn, nausea, vomiting and diarrhea.       "pelvic pain, feels like bone grinding"  Genitourinary: Negative for dysuria.  Musculoskeletal: Positive for back pain.  Skin: Negative for itching and rash.  Neurological: Positive for dizziness, loss of  consciousness, weakness and headaches.  All other systems reviewed and are negative.   Physical Exam   Blood pressure 129/86, pulse 104, temperature 98.8 F (37.1 C), temperature source Oral, resp. rate 18, last menstrual period 06/21/2011, unknown if currently breastfeeding.  Physical Exam  Constitutional: She is oriented to person, place, and time. She appears well-developed and well-nourished. No distress.  HENT:  Head: Normocephalic. Head is without raccoon's eyes.  Eyes: EOM are normal. Pupils are equal, round, and reactive to light.  Neck: Normal range of motion. Neck supple.  Cardiovascular: Normal rate, regular rhythm and normal heart sounds.   No murmur heard. Respiratory: Effort normal and breath sounds normal. No respiratory distress. She has no wheezes. She has no rales.  GI: Soft. There is no tenderness.  Genitourinary: No bleeding around the vagina.  Neurological: She is alert and oriented to person, place, and time.  Skin: Skin is warm and dry.  Psychiatric: She has a normal mood and affect. Her behavior is normal. Judgment and thought content normal.     MAU Course  Procedures 1. EFM 2. Ultrasound: BPP, AFI,placenta 3. KB 4. UA/UDS/pro:cr 6. Type & screen 7 CBC, CMP 8. PT/PTT  Assessment and Plan  1. Fall during third trimester high risk pregnancy  Kuneff, Renee 01/22/2012, 6:58 PM   Report received from R. Kuneff, pt assessed and exam completed.  Information in HPI correct as indicated by [facility].  Consulted with ER MD Dr. Radford Pax and review HPI and exam; recommended a CT scan of head, if negative pt may be discharged home.  Results: Results for orders placed during the hospital encounter of 01/22/12 (from the past 24 hour(s))  URINALYSIS, ROUTINE W REFLEX MICROSCOPIC     Status: Normal   Collection Time   01/22/12  6:20 PM      Component Value Range   Color, Urine YELLOW  YELLOW   APPearance CLEAR  CLEAR   Specific Gravity, Urine 1.015  1.005 - 1.030     pH 7.0  5.0 - 8.0   Glucose, UA NEGATIVE  NEGATIVE mg/dL   Hgb urine dipstick NEGATIVE  NEGATIVE   Bilirubin Urine NEGATIVE  NEGATIVE   Ketones, ur NEGATIVE  NEGATIVE mg/dL   Protein, ur NEGATIVE  NEGATIVE mg/dL   Urobilinogen, UA 1.0  0.0 - 1.0 mg/dL   Nitrite NEGATIVE  NEGATIVE   Leukocytes, UA NEGATIVE  NEGATIVE  URINE RAPID DRUG SCREEN (HOSP PERFORMED)     Status: Normal   Collection Time   01/22/12  6:20 PM      Component Value Range   Opiates NONE DETECTED  NONE DETECTED   Cocaine NONE DETECTED  NONE DETECTED   Benzodiazepines NONE DETECTED  NONE DETECTED   Amphetamines  NONE DETECTED  NONE DETECTED   Tetrahydrocannabinol NONE DETECTED  NONE DETECTED   Barbiturates NONE DETECTED  NONE DETECTED  CBC WITH DIFFERENTIAL     Status: Abnormal   Collection Time   01/22/12  7:10 PM      Component Value Range   WBC 8.7  4.0 - 10.5 K/uL   RBC 3.78 (*) 3.87 - 5.11 MIL/uL   Hemoglobin 9.2 (*) 12.0 - 15.0 g/dL   HCT 16.1 (*) 09.6 - 04.5 %   MCV 76.2 (*) 78.0 - 100.0 fL   MCH 24.3 (*) 26.0 - 34.0 pg   MCHC 31.9  30.0 - 36.0 g/dL   RDW 40.9  81.1 - 91.4 %   Platelets 225  150 - 400 K/uL   Neutrophils Relative 59  43 - 77 %   Neutro Abs 5.1  1.7 - 7.7 K/uL   Lymphocytes Relative 31  12 - 46 %   Lymphs Abs 2.7  0.7 - 4.0 K/uL   Monocytes Relative 7  3 - 12 %   Monocytes Absolute 0.6  0.1 - 1.0 K/uL   Eosinophils Relative 3  0 - 5 %   Eosinophils Absolute 0.3  0.0 - 0.7 K/uL   Basophils Relative 0  0 - 1 %   Basophils Absolute 0.0  0.0 - 0.1 K/uL  PROTIME-INR     Status: Normal   Collection Time   01/22/12  7:10 PM      Component Value Range   Prothrombin Time 13.3  11.6 - 15.2 seconds   INR 1.02  0.00 - 1.49  APTT     Status: Normal   Collection Time   01/22/12  7:10 PM      Component Value Range   aPTT 28  24 - 37 seconds  FIBRINOGEN     Status: Abnormal   Collection Time   01/22/12  7:10 PM      Component Value Range   Fibrinogen 490 (*) 204 - 475 mg/dL  KLEIHAUER-BETKE  STAIN     Status: Normal   Collection Time   01/22/12  7:10 PM      Component Value Range   Fetal Cells % 0.0     Quantitation Fetal Hemoglobin 0    TYPE AND SCREEN     Status: Normal (Preliminary result)   Collection Time   01/22/12  7:10 PM      Component Value Range   ABO/RH(D) O POS     Antibody Screen NEG     Sample Expiration 01/25/2012     Unit Number [id number]     Blood Component Type RBC LR PHER1     Unit division 00     Status of Unit ALLOCATED     Transfusion Status OK TO TRANSFUSE     Crossmatch Result Compatible     Unit Number [id number]     Blood Component Type RED CELLS,LR     Unit division 00     Status of Unit ALLOCATED     Transfusion Status OK TO TRANSFUSE     Crossmatch Result Compatible    COMPREHENSIVE METABOLIC PANEL     Status: Abnormal   Collection Time   01/22/12  7:10 PM      Component Value Range   Sodium 134 (*) 135 - 145 mEq/L   Potassium 2.9 (*) 3.5 - 5.1 mEq/L   Chloride 100  96 - 112 mEq/L   CO2 22  19 - 32 mEq/L  Glucose, Bld 96  70 - 99 mg/dL   BUN 4 (*) 6 - 23 mg/dL   Creatinine, Ser 5.62 (*) 0.50 - 1.10 mg/dL   Calcium 8.8  8.4 - 13.0 mg/dL   Total Protein 6.5  6.0 - 8.3 g/dL   Albumin 2.4 (*) 3.5 - 5.2 g/dL   AST 14  0 - 37 U/L   ALT 10  0 - 35 U/L   Alkaline Phosphatase 158 (*) 39 - 117 U/L   Total Bilirubin 0.3  0.3 - 1.2 mg/dL   GFR calc non Af Amer >90  >90 mL/min   GFR calc Af Amer >90  >90 mL/min  PREPARE RBC (CROSSMATCH)     Status: Normal   Collection Time   01/22/12  8:00 PM      Component Value Range   Order Confirmation ORDER PROCESSED BY BLOOD BANK     Ultrasound:  BPP 8/8; no abruption noted  CT Head - negative  A:  Fall Hypokalemia Anemia  P: DC to home RX  Integra Fioricet #10 (headache) K-Dur 40 meq x 2 days. Keep scheduled appointment or return sooner PRN [facility]

## 2012-01-22 NOTE — Progress Notes (Signed)
Dr. Claiborne Billings  Notified of patient and her compliants, including request to review tracing, ?variable

## 2012-01-22 NOTE — MAU Note (Signed)
Patient is in with c/o constant pelvic pain since she fell down the steps this morning at 11am. She denies any vaginal bleeding, lof or contractions. She reports good fetal movement. She states that she sent a message to her doctor and she just received a call to come to the hospital. Patient states that she have episodes of faint spells, which she had at the time of her fall. She is a gestational diabetic who isn't on any glycemic meds at this time.

## 2012-01-22 NOTE — Telephone Encounter (Signed)
I called pt regarding her message sent via My Chart wherein she c/o having headaches, fainting spells, seeing dots and pelvic pain. I advised her that she needs to be evaluated @ [facility] today. I emphasized that it is very important for her to come to the hospital. Pt voiced understanding and stated that her mother can bring her in.

## 2012-01-23 MED ORDER — HYDROMORPHONE HCL PF 1 MG/ML IJ SOLN
1.0000 mg | Freq: Once | INTRAMUSCULAR | Status: AC
  Administered 2012-01-23: 1 mg via INTRAMUSCULAR
  Filled 2012-01-23: qty 1

## 2012-01-23 NOTE — MAU Note (Signed)
Discussed with pharmacist pt allergies and if it is ok to give Dilaudid IM to patient

## 2012-01-24 LAB — TYPE AND SCREEN
ABO/RH(D): O POS
Antibody Screen: NEGATIVE
Unit division: 0

## 2012-01-29 ENCOUNTER — Encounter (INDEPENDENT_AMBULATORY_CARE_PROVIDER_SITE_OTHER): Payer: Medicaid Other

## 2012-01-29 ENCOUNTER — Ambulatory Visit (INDEPENDENT_AMBULATORY_CARE_PROVIDER_SITE_OTHER): Payer: Medicaid Other | Admitting: Cardiology

## 2012-01-29 ENCOUNTER — Encounter: Payer: Self-pay | Admitting: Cardiology

## 2012-01-29 VITALS — BP 120/78 | HR 94 | Ht 67.0 in | Wt 246.4 lb

## 2012-01-29 DIAGNOSIS — R55 Syncope and collapse: Secondary | ICD-10-CM

## 2012-01-29 NOTE — Patient Instructions (Addendum)
The current medical regimen is effective;  continue present plan and medications.  Please return for a 8 am lab draw for Aldosterone/renin and basic metabolic panel.  This must be done with in the next 1 to 2 days  Your physician has recommended that you wear an event monitor. Event monitors are medical devices that record the heart's electrical activity. Doctors most often [place] these monitors to diagnose arrhythmias. Arrhythmias are problems with the speed or rhythm of the heartbeat. The monitor is a small, portable device. You can wear one while you do your normal daily activities. This is usually used to diagnose what is causing palpitations/syncope (passing out).  Your physician has requested that you have an echocardiogram. Echocardiography is a painless test that uses sound waves to create images of your heart. It provides your doctor with information about the size and shape of your heart and how well your heart's chambers and valves are working. This procedure takes approximately one hour. There are no restrictions for this procedure.  Please wear compression stockings daily.  DO NOT DRIVE A VEHICLE OF ANY KIND!!!

## 2012-01-29 NOTE — Progress Notes (Signed)
HPI The patient presents for evaluation of syncope. She is [redacted] weeks pregnant with her fourth child. She had syncope with 2 of her other babies apparently. She has had high risk surgeries with preeclampsia. She has required C-section. She also has a history of hypokalemia. She says that she's had 3 syncopal episodes in the last 2 weeks. They have all occurred while standing. One was after some prolonged standing but the other 2 were after only 5 minutes. She gets warm. He feels her heart racing. She thinks her heart racing occurs first and then she would lose consciousness. She does not feel any chest pressure, neck or arm discomfort. She does not describe any orthostatic symptoms. She has had no new shortness of breath, PND or orthopnea. Of note she was in the emergency room recently and I do see a head CT was negative. She was hypokalemic a few days ago at 2.9 and did receive 40 mEq of potassium for 2 days. She also describes some headaches that occur with the syncope. She's never had any prior cardiac workup.  Allergies  Allergen Reactions  . Vicodin (Hydrocodone-Acetaminophen) Shortness Of Breath and Swelling    Current Outpatient Prescriptions  Medication Sig Dispense Refill  . butalbital-acetaminophen-caffeine (FIORICET, ESGIC) 50-325-40 MG per tablet Take 1 tablet by mouth once.  10 tablet  0  . cyclobenzaprine (FLEXERIL) 10 MG tablet Take 1 tablet (10 mg total) by mouth 3 (three) times daily as needed for muscle spasms.  30 tablet  0  . diphenhydrAMINE (BENADRYL) 25 mg capsule Take 1 capsule (25 mg total) by mouth at bedtime as needed for itching.  30 capsule  0  . diphenhydrAMINE-zinc acetate (BENADRYL) cream Apply topically 3 (three) times daily as needed for itching.  28.3 g  0  . docusate sodium (COLACE) 100 MG capsule Take 1 capsule (100 mg total) by mouth 2 (two) times daily.  60 capsule  2  . Fe Fum-FePoly-FA-Vit C-Vit B3 (INTEGRA F) 125-1 MG CAPS Take 1 tablet by mouth daily.  30  capsule  1  . ferrous sulfate 325 (65 FE) MG tablet Take 1 tablet (325 mg total) by mouth daily.  30 tablet  3  . loratadine (CLARITIN) 10 MG tablet Take 1 tablet (10 mg total) by mouth daily.  30 tablet  3  . Misc. Devices (TRACTION PELVIC BELT) MISC 1 Units by Does not apply route once.  1 each  0  . potassium chloride SA (K-DUR,KLOR-CON) 20 MEQ tablet Take 2 tablets (40 mEq total) by mouth daily.  4 tablet  0  . Prenatal Vit-Fe Fumarate-FA (PRENATAL MULTIVITAMIN) TABS Take 1 tablet by mouth daily.        Past Medical History  Diagnosis Date  . Anemia   . Potassium (K) deficiency   . Headache   . Pregnancy induced hypertension     all preg  . Gestational diabetes     all  . Urinary tract infection   . Depression     doing good    Past Surgical History  Procedure Date  . Cesarean section   . Tonsillectomy   . Cholecystectomy   . Cesarean section 10/12/2010    Procedure: CESAREAN SECTION;  Surgeon: Lazaro Arms, MD;  Location: [facility];  Service: Gynecology;  Laterality: N/A;  Repeat Cesarean Section with Delivery Baby Boy @ 1756; Apgars 7/9  . Cesarean section 10/12/2010    Procedure: CESAREAN SECTION;  Surgeon: Lazaro Arms, MD;  Location: [facility];  Service: Gynecology;  Laterality: N/A;    Family History  Problem Relation Age of Onset  . Hypertension Mother   . Diabetes Mother   . Hypertension Father   . Diabetes Father   . Hypertension Maternal Grandmother   . Diabetes Maternal Grandmother   . Other Neg Hx   . Early death Sister     History   Social History  . Marital Status: Single    Spouse Name: N/A    Number of Children: N/A  . Years of Education: N/A   Occupational History  . Not on file.   Social History Main Topics  . Smoking status: Former Smoker    Types: Cigarettes  . Smokeless tobacco: Never Used     Comment: 2012  . Alcohol Use: No  . Drug Use: No  . Sexually Active: Yes   Other Topics Concern  . Not on file   Social History Narrative    . No narrative on file    ROS:  Positive for leg swelling.  Otherwise as stated in the HPI and negative for all other systems.  PHYSICAL EXAM BP 127/81  Pulse 88  Ht 5\' 7"  (1.702 m)  Wt 246 lb 6.4 oz (111.766 kg)  BMI 38.59 kg/m2  LMP 06/21/2011 GENERAL:  Well appearing HEENT:  Pupils equal round and reactive, fundi not visualized, oral mucosa unremarkable NECK:  No jugular venous distention, waveform within normal limits, carotid upstroke brisk and symmetric, no bruits, no thyromegaly LYMPHATICS:  No cervical, inguinal adenopathy LUNGS:  Clear to auscultation bilaterally BACK:  No CVA tenderness CHEST:  Unremarkable HEART:  PMI not displaced or sustained,S1 and S2 within normal limits, no S3, no S4, no clicks, no rubs, no murmurs ABD:  Flat, positive bowel sounds normal in frequency in pitch, no bruits, no rebound, no guarding, no midline pulsatile mass, no hepatomegaly, no splenomegaly, gravid EXT:  2 plus pulses throughout, trace edema, no cyanosis no clubbing SKIN:  No rashes no nodules NEURO:  Cranial nerves II through XII grossly intact, motor grossly intact throughout PSYCH:  Cognitively intact, oriented to person place and time  EKG:  Sinus rhythm, rate 58, axis within normal limits, intervals within normal limits, minimal voltage criteria for left ventricular hypertrophy, no acute ST-T wave changes.  ASSESSMENT AND PLAN  Syncope -  The etiology of this sounds like a possible vagal episode versus dysrhythmia. I will check a 21 day event monitor. She will also get an echocardiogram. We discussed avoidance measures and caution given this. She cannot drive.  Hypertension This will be evaluated as below.  Hypokalemia I will check a renin and aldosterone aldosterone level for her ratio to rule out primary hyperaldosteronism. I will check a basic metabolic profile today.  I do note that she's had a negative CT in the past with no suggestion of adrenal mass.

## 2012-01-29 NOTE — Progress Notes (Unsigned)
Patient ID: Brianna Reeves, female   DOB: 1985-08-05, 26 y.o.   MRN: [id number] Place a event monitor on patient while in office on 01/29/2012. It was a e cardio monitor, end date is 02/19/12

## 2012-01-30 ENCOUNTER — Ambulatory Visit (INDEPENDENT_AMBULATORY_CARE_PROVIDER_SITE_OTHER): Payer: Medicaid Other | Admitting: Family Medicine

## 2012-01-30 ENCOUNTER — Other Ambulatory Visit: Payer: Self-pay

## 2012-01-30 ENCOUNTER — Other Ambulatory Visit: Payer: Medicaid Other

## 2012-01-30 VITALS — BP 123/72 | Temp 97.3°F | Wt 249.0 lb

## 2012-01-30 DIAGNOSIS — R55 Syncope and collapse: Secondary | ICD-10-CM

## 2012-01-30 DIAGNOSIS — O10019 Pre-existing essential hypertension complicating pregnancy, unspecified trimester: Secondary | ICD-10-CM

## 2012-01-30 LAB — POCT URINALYSIS DIP (DEVICE)
Ketones, ur: NEGATIVE mg/dL
Leukocytes, UA: NEGATIVE
Protein, ur: 30 mg/dL — AB
Urobilinogen, UA: >=8 mg/dL (ref 0.0–1.0)

## 2012-01-30 NOTE — Patient Instructions (Signed)
Pregnancy - Third Trimester The third trimester of pregnancy (the last 3 months) is a period of the most rapid growth for you and your baby. The baby approaches a length of 20 inches and a weight of 6 to 10 pounds. The baby is adding on fat and getting ready for life outside your body. While inside, babies have periods of sleeping and waking, suck their thumbs, and hiccups. You can often feel small contractions of the uterus. This is false labor. It is also called Braxton-Hicks contractions. This is like a practice for labor. The usual problems in this stage of pregnancy include more difficulty breathing, swelling of the hands and feet from water retention, and having to urinate more often because of the uterus and baby pressing on your bladder.  PRENATAL EXAMS  Blood work may continue to be done during prenatal exams. These tests are done to check on your health and the probable health of your baby. Blood work is used to follow your blood levels (hemoglobin). Anemia (low hemoglobin) is common during pregnancy. Iron and vitamins are given to help prevent this. You may also continue to be checked for diabetes. Some of the past blood tests may be done again.  The size of the uterus is measured during each visit. This makes sure your baby is growing properly according to your pregnancy dates.  Your blood pressure is checked every prenatal visit. This is to make sure you are not getting toxemia.  Your urine is checked every prenatal visit for infection, diabetes and protein.  Your weight is checked at each visit. This is done to make sure gains are happening at the suggested rate and that you and your baby are growing normally.  Sometimes, an ultrasound is performed to confirm the position and the proper growth and development of the baby. This is a test done that bounces harmless sound waves off the baby so your caregiver can more accurately determine due dates.  Discuss the type of pain medication and  anesthesia you will have during your labor and delivery.  Discuss the possibility and anesthesia if a Cesarean Section might be necessary.  Inform your caregiver if there is any mental or physical violence at home. Sometimes, a specialized non-stress test, contraction stress test and biophysical profile are done to make sure the baby is not having a problem. Checking the amniotic fluid surrounding the baby is called an amniocentesis. The amniotic fluid is removed by sticking a needle into the belly (abdomen). This is sometimes done near the end of pregnancy if an early delivery is required. In this case, it is done to help make sure the baby's lungs are mature enough for the baby to live outside of the womb. If the lungs are not mature and it is unsafe to deliver the baby, an injection of cortisone medication is given to the mother 1 to 2 days before the delivery. This helps the baby's lungs mature and makes it safer to deliver the baby. CHANGES OCCURING IN THE THIRD TRIMESTER OF PREGNANCY Your body goes through many changes during pregnancy. They vary from person to person. Talk to your caregiver about changes you notice and are concerned about.  During the last trimester, you have probably had an increase in your appetite. It is normal to have cravings for certain foods. This varies from person to person and pregnancy to pregnancy.  You may begin to get stretch marks on your hips, abdomen, and breasts. These are normal changes in the body   during pregnancy. There are no exercises or medications to take which prevent this change.  Constipation may be treated with a stool softener or adding bulk to your diet. Drinking lots of fluids, fiber in vegetables, fruits, and whole grains are helpful.  Exercising is also helpful. If you have been very active up until your pregnancy, most of these activities can be continued during your pregnancy. If you have been less active, it is helpful to start an exercise  program such as walking. Consult your caregiver before starting exercise programs.  Avoid all smoking, alcohol, un-prescribed drugs, herbs and "street drugs" during your pregnancy. These chemicals affect the formation and growth of the baby. Avoid chemicals throughout the pregnancy to ensure the delivery of a healthy infant.  Backache, varicose veins and hemorrhoids may develop or get worse.  You will tire more easily in the third trimester, which is normal.  The baby's movements may be stronger and more often.  You may become short of breath easily.  Your belly button may stick out.  A yellow discharge may leak from your breasts called colostrum.  You may have a bloody mucus discharge. This usually occurs a few days to a week before labor begins. HOME CARE INSTRUCTIONS   Keep your caregiver's appointments. Follow your caregiver's instructions regarding medication use, exercise, and diet.  During pregnancy, you are providing food for you and your baby. Continue to eat regular, well-balanced meals. Choose foods such as meat, fish, milk and other low fat dairy products, vegetables, fruits, and whole-grain breads and cereals. Your caregiver will tell you of the ideal weight gain.  A physical sexual relationship may be continued throughout pregnancy if there are no other problems such as early (premature) leaking of amniotic fluid from the membranes, vaginal bleeding, or belly (abdominal) pain.  Exercise regularly if there are no restrictions. Check with your caregiver if you are unsure of the safety of your exercises. Greater weight gain will occur in the last 2 trimesters of pregnancy. Exercising helps:  Control your weight.  Get you in shape for labor and delivery.  You lose weight after you deliver.  Rest a lot with legs elevated, or as needed for leg cramps or low back pain.  Wear a good support or jogging bra for breast tenderness during pregnancy. This may help if worn during  sleep. Pads or tissues may be used in the bra if you are leaking colostrum.  Do not use hot tubs, steam rooms, or saunas.  Wear your seat belt when driving. This protects you and your baby if you are in an accident.  Avoid raw meat, cat litter boxes and soil used by cats. These carry germs that can cause birth defects in the baby.  It is easier to loose urine during pregnancy. Tightening up and strengthening the pelvic muscles will help with this problem. You can practice stopping your urination while you are going to the bathroom. These are the same muscles you need to strengthen. It is also the muscles you would use if you were trying to stop from passing gas. You can practice tightening these muscles up 10 times a set and repeating this about 3 times per day. Once you know what muscles to tighten up, do not perform these exercises during urination. It is more likely to cause an infection by backing up the urine.  Ask for help if you have financial, counseling or nutritional needs during pregnancy. Your caregiver will be able to offer counseling for these   needs as well as refer you for other special needs.  Make a list of emergency phone numbers and have them available.  Plan on getting help from family or friends when you go home from the hospital.  Make a trial run to the hospital.  Take prenatal classes with the father to understand, practice and ask questions about the labor and delivery.  Prepare the baby's room/nursery.  Do not travel out of the city unless it is absolutely necessary and with the advice of your caregiver.  Wear only low or no heal shoes to have better balance and prevent falling. MEDICATIONS AND DRUG USE IN PREGNANCY  Take prenatal vitamins as directed. The vitamin should contain 1 milligram of folic acid. Keep all vitamins out of reach of children. Only a couple vitamins or tablets containing iron may be fatal to a baby or young child when ingested.  Avoid use  of all medications, including herbs, over-the-counter medications, not prescribed or suggested by your caregiver. Only take over-the-counter or prescription medicines for pain, discomfort, or fever as directed by your caregiver. Do not use aspirin, ibuprofen (Motrin, Advil, Nuprin) or naproxen (Aleve) unless OK'd by your caregiver.  Let your caregiver also know about herbs you may be using.  Alcohol is related to a number of birth defects. This includes fetal alcohol syndrome. All alcohol, in any form, should be avoided completely. Smoking will cause low birth rate and premature babies.  Street/illegal drugs are very harmful to the baby. They are absolutely forbidden. A baby born to an addicted mother will be addicted at birth. The baby will go through the same withdrawal an adult does. SEEK MEDICAL CARE IF: You have any concerns or worries during your pregnancy. It is better to call with your questions if you feel they cannot wait, rather than worry about them. DECISIONS ABOUT CIRCUMCISION You may or may not know the sex of your baby. If you know your baby is a boy, it may be time to think about circumcision. Circumcision is the removal of the foreskin of the penis. This is the skin that covers the sensitive end of the penis. There is no proven medical need for this. Often this decision is made on what is popular at the time or based upon religious beliefs and social issues. You can discuss these issues with your caregiver or pediatrician. SEEK IMMEDIATE MEDICAL CARE IF:   An unexplained oral temperature above 102 F (38.9 C) develops, or as your caregiver suggests.  You have leaking of fluid from the vagina (birth canal). If leaking membranes are suspected, take your temperature and tell your caregiver of this when you call.  There is vaginal spotting, bleeding or passing clots. Tell your caregiver of the amount and how many pads are used.  You develop a bad smelling vaginal discharge with  a change in the color from clear to white.  You develop vomiting that lasts more than 24 hours.  You develop chills or fever.  You develop shortness of breath.  You develop burning on urination.  You loose more than 2 pounds of weight or gain more than 2 pounds of weight or as suggested by your caregiver.  You notice sudden swelling of your face, hands, and feet or legs.  You develop belly (abdominal) pain. Round ligament discomfort is a common non-cancerous (benign) cause of abdominal pain in pregnancy. Your caregiver still must evaluate you.  You develop a severe headache that does not go away.  You develop visual   problems, blurred or double vision.  If you have not felt your baby move for more than 1 hour. If you think the baby is not moving as much as usual, eat something with sugar in it and lie down on your left side for an hour. The baby should move at least 4 to 5 times per hour. Call right away if your baby moves less than that.  You fall, are in a car accident or any kind of trauma.  There is mental or physical violence at home. Document Released: 02/26/2001 Document Revised: 05/27/2011 Document Reviewed: 08/31/2008 Exit[employer] Patient Information 2013 [employer].  Hypertension During Pregnancy Hypertension is also called high blood pressure. It can occur at any time in life and during pregnancy. When you have hypertension, there is extra pressure inside your blood vessels that carry blood from the heart to the rest of your body (arteries). Hypertension during pregnancy can cause problems for you and your baby. Your baby might not weigh as much as it should at birth or might be born early (premature). Very bad cases of hypertension during pregnancy can be life-threatening.  There are different types of hypertension during pregnancy.   Chronic hypertension. This happens when a woman has hypertension before pregnancy and it continues during pregnancy.  Gestational  hypertension. This is when hypertension develops during pregnancy.  Preeclampsia or toxemia of pregnancy. This is a very serious type of hypertension that develops only during pregnancy. It is a disease that affects the whole body (systemic) and can be very dangerous for both mother and baby.  Gestational hypertension and preeclampsia usually go away after your baby is born. Blood pressure generally stabilizes within 6 weeks. Women who have hypertension during pregnancy have a greater chance of developing hypertension later in life or with future pregnancies. UNDERSTANDING BLOOD PRESSURE Blood pressure moves blood in your body. Sometimes, the force that moves the blood becomes too strong.  A blood pressure reading is given in 2 numbers and looks like a fraction.  The top number is called the systolic pressure. When your heart beats, it forces more blood to flow through the arteries. Pressure inside the arteries goes up.  The bottom number is the diastolic pressure. Pressure goes down between beats. That is when the heart is resting.  You may have hypertension if:  Your systolic blood pressure is above 140.  Your diastolic pressure is above 90. RISK FACTORS Some factors make you more likely to develop hypertension during pregnancy. Risk factors include:  Having hypertension before pregnancy.  Having hypertension during a previous pregnancy.  Being overweight.  Being older than 40.  Being pregnant with more than 1 baby (multiples).  Having diabetes or kidney problems. SYMPTOMS Chronic and gestational hypertension may not cause symptoms. Preeclampsia has symptoms, which may include:  Increased protein in your urine. Your caregiver will check for this at every prenatal visit.  Swelling of your hands and face.  Rapid weight gain.  Headaches.  Visual changes.  Being bothered by light.  Abdominal pain, especially in the right upper area.  Chest pain.  Shortness of  breath.  Increased reflexes.  Seizures. Seizures occur with a more severe form of preeclampsia, called eclampsia. DIAGNOSIS   You may be diagnosed with hypertension during pregnancy during a regular prenatal exam. At each visit, tests may include:  Blood pressure checks.  A urine test to check for protein in your urine.  The type of hypertension you are diagnosed with depends on when you developed   it. It also depends on your specific blood pressure reading.  Developing hypertension before 20 weeks of pregnancy is consistent with chronic hypertension.  Developing hypertension after 20 weeks of pregnancy is consistent with gestational hypertension.  Hypertension with increased urinary protein is diagnosed as preeclampsia.  Blood pressure measurements that stay above 160 systolic or 110 diastolic are a sign of severe preeclampsia. TREATMENT Treatment for hypertension during pregnancy varies. Treatment depends on the type of hypertension and how serious it is.  If you take medicine for chronic hypertension, you may need to switch medicines.  Drugs called ACE inhibitors should not be taken during pregnancy.  Low-dose aspirin may be suggested for women who have risk factors for preeclampsia.  If you have gestational hypertension, you may need to take a blood pressure medicine that is safe during pregnancy. Your caregiver will recommend the appropriate medicine.  If you have severe preeclampsia, you may need to be in the hospital. Caregivers will watch you and the baby very closely. You also may need to take medicine (magnesium sulfate) to prevent seizures and lower blood pressure.  Sometimes an early delivery is needed. This may be the case if the condition worsens. It would be done to protect you and the baby. The only cure for preeclampsia is delivery. HOME CARE INSTRUCTIONS  Schedule and keep all of your regular prenatal care.  Follow your caregiver's instructions for taking  medicines. Tell your caregiver about all medicines you take. This includes over-the-counter medicines.  Eat as little salt as possible.  Get regular exercise.  Do not drink alcohol.  Do not use tobacco products.  Do not drink products with caffeine.  Lie on your left side when resting.  Tell your doctor if you have any preeclampsia symptoms. SEEK IMMEDIATE MEDICAL CARE IF:  You have severe abdominal pain.  You have sudden swelling in the hands, ankles, or face.  You gain 4 pounds (1.8 kg) or more in 1 week.  You vomit repeatedly.  You have vaginal bleeding.  You do not feel the baby moving as much.  You have a headache.  You have blurred or double vision.  You have muscle twitching or spasms.  You have shortness of breath.  You have blue fingernails and lips.  You have blood in your urine. MAKE SURE YOU:  Understand these instructions.  Will watch your condition.  Will get help right away if you are not doing well. Document Released: 11/20/2010 Document Revised: 05/27/2011 Document Reviewed: 11/20/2010 [employer] Patient Information 2013 [employer].  

## 2012-01-30 NOTE — Progress Notes (Signed)
Pulse- 99  Edema- hands  Pain/pressure- lower abd

## 2012-01-30 NOTE — Progress Notes (Signed)
Fainting spells - saw cardiology yesterday. Has 21-day event monitor and echo scheduled, renin/aldosterone level . F/U next week. Has not passed out since she saw cardiology. No contractions/leaking fluid/bleeding. Still having itching. Bile acids 18 (upper end of normal) on 11/6.  Headaches and spots in vision chronic. 24 hour urine protein 105, normal CMP.  Sono for growth at 34 weeks. Start antenatal testing next week.

## 2012-02-03 ENCOUNTER — Ambulatory Visit (INDEPENDENT_AMBULATORY_CARE_PROVIDER_SITE_OTHER): Payer: Medicaid Other | Admitting: *Deleted

## 2012-02-03 VITALS — BP 110/55 | Wt 250.8 lb

## 2012-02-03 DIAGNOSIS — O10019 Pre-existing essential hypertension complicating pregnancy, unspecified trimester: Secondary | ICD-10-CM

## 2012-02-03 NOTE — Progress Notes (Signed)
NST reviewed and reactive.  Brianna Reeves, M.D., FACOG    

## 2012-02-03 NOTE — Progress Notes (Signed)
[id number] 

## 2012-02-05 ENCOUNTER — Ambulatory Visit (HOSPITAL_COMMUNITY): Payer: Medicaid Other | Attending: Cardiology

## 2012-02-05 DIAGNOSIS — R55 Syncope and collapse: Secondary | ICD-10-CM

## 2012-02-05 DIAGNOSIS — Z87891 Personal history of nicotine dependence: Secondary | ICD-10-CM | POA: Insufficient documentation

## 2012-02-05 DIAGNOSIS — O99891 Other specified diseases and conditions complicating pregnancy: Secondary | ICD-10-CM | POA: Insufficient documentation

## 2012-02-05 NOTE — Progress Notes (Signed)
Echocardiogram performed.  

## 2012-02-06 ENCOUNTER — Other Ambulatory Visit: Payer: Medicaid Other

## 2012-02-10 ENCOUNTER — Other Ambulatory Visit: Payer: Medicaid Other

## 2012-02-20 ENCOUNTER — Inpatient Hospital Stay (HOSPITAL_COMMUNITY): Payer: Medicaid Other

## 2012-02-20 ENCOUNTER — Encounter (HOSPITAL_COMMUNITY): Payer: Self-pay | Admitting: *Deleted

## 2012-02-20 ENCOUNTER — Inpatient Hospital Stay (HOSPITAL_COMMUNITY)
Admission: AD | Admit: 2012-02-20 | Discharge: 2012-02-20 | Disposition: A | Discharge status: Home / Self Care | Payer: Medicaid Other | Source: Ambulatory Visit | Attending: Obstetrics & Gynecology | Admitting: Obstetrics & Gynecology

## 2012-02-20 DIAGNOSIS — Z8659 Personal history of other mental and behavioral disorders: Secondary | ICD-10-CM

## 2012-02-20 DIAGNOSIS — O26899 Other specified pregnancy related conditions, unspecified trimester: Secondary | ICD-10-CM

## 2012-02-20 DIAGNOSIS — Z98891 History of uterine scar from previous surgery: Secondary | ICD-10-CM

## 2012-02-20 DIAGNOSIS — O99891 Other specified diseases and conditions complicating pregnancy: Secondary | ICD-10-CM | POA: Insufficient documentation

## 2012-02-20 DIAGNOSIS — O10019 Pre-existing essential hypertension complicating pregnancy, unspecified trimester: Secondary | ICD-10-CM

## 2012-02-20 DIAGNOSIS — N949 Unspecified condition associated with female genital organs and menstrual cycle: Secondary | ICD-10-CM | POA: Insufficient documentation

## 2012-02-20 DIAGNOSIS — O09899 Supervision of other high risk pregnancies, unspecified trimester: Secondary | ICD-10-CM

## 2012-02-20 MED ORDER — BUTORPHANOL TARTRATE 1 MG/ML IJ SOLN
1.0000 mg | Freq: Once | INTRAMUSCULAR | Status: AC
Start: 2012-02-20 — End: 2012-02-20
  Administered 2012-02-20: 1 mg via INTRAVENOUS
  Filled 2012-02-20: qty 1

## 2012-02-20 MED ORDER — OXYCODONE-ACETAMINOPHEN 5-325 MG PO TABS: 1.0000 | ORAL_TABLET | ORAL | Status: DC | PRN

## 2012-02-20 NOTE — MAU Provider Note (Signed)
History     CSN: [id number]  Arrival date and time: 02/20/12 1032   First Provider Initiated Contact with Patient 02/20/12 1118      Chief Complaint  Patient presents with  . Labor Eval   HPI  Pt is a 26 yo G4P3003 at 34.6 wks IUP here with report of lower pelvic pain that increased in intensity last night, unable to sleep.  Pain is described as "sharp",  No vaginal bleeding or leaking of fluid.  +mucus plug.  +fetal movement.    Past Medical History  Diagnosis Date  . Anemia   . Potassium (K) deficiency   . Headache   . Pregnancy induced hypertension     all preg  . Gestational diabetes     all  . Urinary tract infection   . Depression     doing good    Past Surgical History  Procedure Date  . Cesarean section   . Tonsillectomy   . Cholecystectomy   . Cesarean section 10/12/2010    Procedure: CESAREAN SECTION;  Surgeon: Lazaro Arms, MD;  Location: [facility];  Service: Gynecology;  Laterality: N/A;  Repeat Cesarean Section with Delivery Baby Boy @ 1756; Apgars 7/9  . Cesarean section 10/12/2010    Procedure: CESAREAN SECTION;  Surgeon: Lazaro Arms, MD;  Location: [facility];  Service: Gynecology;  Laterality: N/A;    Family History  Problem Relation Age of Onset  . Hypertension Mother   . Diabetes Mother   . Hypertension Father   . Diabetes Father   . Hypertension Maternal Grandmother   . Diabetes Maternal Grandmother   . Early death Sister     Died age 10 ("natural cause")    History  Substance Use Topics  . Smoking status: Former Smoker    Types: Cigarettes  . Smokeless tobacco: Never Used     Comment: 2012  . Alcohol Use: No    Allergies:  Allergies  Allergen Reactions  . Vicodin (Hydrocodone-Acetaminophen) Shortness Of Breath and Swelling    Prescriptions prior to admission  Medication Sig Dispense Refill  . butalbital-acetaminophen-caffeine (FIORICET, ESGIC) 50-325-40 MG per tablet Take 1 tablet by mouth once.  10 tablet  0  . Fe  Fum-FePoly-FA-Vit C-Vit B3 (INTEGRA F) 125-1 MG CAPS Take 1 tablet by mouth daily.  30 capsule  1  . ferrous sulfate 325 (65 FE) MG tablet Take 1 tablet (325 mg total) by mouth daily.  30 tablet  3  . potassium chloride SA (K-DUR,KLOR-CON) 20 MEQ tablet Take 2 tablets (40 mEq total) by mouth daily.  4 tablet  0  . Misc. Devices (TRACTION PELVIC BELT) MISC 1 Units by Does not apply route once.  1 each  0    Review of Systems  Gastrointestinal: Positive for abdominal pain (pelvic pain).  All other systems reviewed and are negative.   Physical Exam   Blood pressure 98/65, pulse 100, temperature 98 F (36.7 C), temperature source Oral, resp. rate 20, height 5\' 6"  (1.676 m), weight 113.853 kg (251 lb), last menstrual period 06/21/2011.  Physical Exam  Constitutional: She is oriented to person, place, and time. She appears well-developed and well-nourished. No distress.  HENT:  Head: Normocephalic.  Neck: Normal range of motion. Neck supple.  Cardiovascular: Normal rate, regular rhythm and normal heart sounds.   Respiratory: Effort normal and breath sounds normal.  GI: Soft. There is no tenderness.  Genitourinary: Uterus normal. No bleeding around the vagina. Vaginal discharge (mucusy) found.  Musculoskeletal:  Normal range of motion. She exhibits no edema.  Neurological: She is alert and oriented to person, place, and time.  Skin: Skin is warm and dry.   Dilation: Closed Effacement (%): Thick Cervical Position: Posterior Exam by:: Ginger Morris RN  MAU Course  Procedures  No results found for this or any previous visit (from the past 24 hour(s)). Ultrasound:   FHR 130's, +accels, reactive Toco - irregular  Assessment and Plan  26 yo G4P3003 at 34.6wks Pelvic Pain in Pregnancy - Normal Exam  Plan: Discharge to home Preterm Labor precautions RX Percocet #10 Consulted with Dr. Marice Potter - agrees with plan    [facility] 02/20/2012, 2:18 PM

## 2012-02-20 NOTE — MAU Note (Signed)
Couldn't get no sleep last night, has been contracting, and having pelvic pressure.  Lost mucous plug in the shower this morning

## 2012-02-21 ENCOUNTER — Telehealth: Payer: Self-pay | Admitting: *Deleted

## 2012-02-21 NOTE — Telephone Encounter (Signed)
Pt left message stating that she was seen @ MAU yesterday. She is calling to find out when her c/section will be.  Please call back.

## 2012-02-22 ENCOUNTER — Encounter (HOSPITAL_COMMUNITY): Payer: Self-pay | Admitting: Obstetrics and Gynecology

## 2012-02-22 ENCOUNTER — Encounter (HOSPITAL_COMMUNITY): Payer: Self-pay | Admitting: Anesthesiology

## 2012-02-22 ENCOUNTER — Inpatient Hospital Stay (HOSPITAL_COMMUNITY)
Admission: AD | Admit: 2012-02-22 | Discharge: 2012-02-25 | DRG: 765 | Disposition: A | Discharge status: Home / Self Care | Payer: Medicaid Other | Source: Ambulatory Visit | Attending: Obstetrics & Gynecology | Admitting: Obstetrics & Gynecology

## 2012-02-22 ENCOUNTER — Encounter (HOSPITAL_COMMUNITY): Payer: Self-pay | Admitting: *Deleted

## 2012-02-22 ENCOUNTER — Encounter (HOSPITAL_COMMUNITY)
Admission: AD | Disposition: A | Discharge status: Home / Self Care | Payer: Self-pay | Source: Ambulatory Visit | Attending: Obstetrics & Gynecology

## 2012-02-22 ENCOUNTER — Inpatient Hospital Stay (HOSPITAL_COMMUNITY): Payer: Medicaid Other | Admitting: Anesthesiology

## 2012-02-22 DIAGNOSIS — I10 Essential (primary) hypertension: Secondary | ICD-10-CM

## 2012-02-22 DIAGNOSIS — E876 Hypokalemia: Secondary | ICD-10-CM

## 2012-02-22 DIAGNOSIS — O10019 Pre-existing essential hypertension complicating pregnancy, unspecified trimester: Secondary | ICD-10-CM

## 2012-02-22 DIAGNOSIS — Z98891 History of uterine scar from previous surgery: Secondary | ICD-10-CM

## 2012-02-22 DIAGNOSIS — IMO0002 Reserved for concepts with insufficient information to code with codable children: Secondary | ICD-10-CM

## 2012-02-22 DIAGNOSIS — O34219 Maternal care for unspecified type scar from previous cesarean delivery: Secondary | ICD-10-CM | POA: Diagnosis present

## 2012-02-22 DIAGNOSIS — O99892 Other specified diseases and conditions complicating childbirth: Secondary | ICD-10-CM | POA: Diagnosis present

## 2012-02-22 DIAGNOSIS — N736 Female pelvic peritoneal adhesions (postinfective): Secondary | ICD-10-CM | POA: Diagnosis present

## 2012-02-22 DIAGNOSIS — Z302 Encounter for sterilization: Secondary | ICD-10-CM

## 2012-02-22 DIAGNOSIS — Z8659 Personal history of other mental and behavioral disorders: Secondary | ICD-10-CM

## 2012-02-22 DIAGNOSIS — O09899 Supervision of other high risk pregnancies, unspecified trimester: Secondary | ICD-10-CM

## 2012-02-22 HISTORY — PX: TUBAL LIGATION: SHX77

## 2012-02-22 LAB — COMPREHENSIVE METABOLIC PANEL
Albumin: 2.4 g/dL — ABNORMAL LOW (ref 3.5–5.2)
BUN: 6 mg/dL (ref 6–23)
Calcium: 9.2 mg/dL (ref 8.4–10.5)
GFR calc Af Amer: >90 mL/min (ref >90)
Glucose, Bld: 84 mg/dL (ref 70–99)
Potassium: 3.8 mEq/L (ref 3.5–5.1)
Total Protein: 6.7 g/dL (ref 6.0–8.3)

## 2012-02-22 LAB — PROTEIN / CREATININE RATIO, URINE
Creatinine, Urine: 193.92 mg/dL
Protein Creatinine Ratio: 0.19 — ABNORMAL HIGH (ref 0.00–0.15)
Total Protein, Urine: 36.8 mg/dL

## 2012-02-22 LAB — URINALYSIS, ROUTINE W REFLEX MICROSCOPIC
Ketones, ur: NEGATIVE mg/dL
Leukocytes, UA: NEGATIVE
Nitrite: NEGATIVE
Protein, ur: NEGATIVE mg/dL
Urobilinogen, UA: 4 mg/dL — ABNORMAL HIGH (ref 0.0–1.0)

## 2012-02-22 LAB — PREPARE RBC (CROSSMATCH)

## 2012-02-22 LAB — CBC
HCT: 29.6 % — ABNORMAL LOW (ref 36.0–46.0)
Hemoglobin: 9.1 g/dL — ABNORMAL LOW (ref 12.0–15.0)
MCH: 22.8 pg — ABNORMAL LOW (ref 26.0–34.0)
MCHC: 30.7 g/dL (ref 30.0–36.0)
RDW: 15.7 % — ABNORMAL HIGH (ref 11.5–15.5)

## 2012-02-22 LAB — GLUCOSE, CAPILLARY: Glucose-Capillary: 97 mg/dL (ref 70–99)

## 2012-02-22 LAB — WET PREP, GENITAL

## 2012-02-22 SURGERY — Surgical Case
Anesthesia: Epidural | Site: Abdomen | Wound class: Clean Contaminated

## 2012-02-22 MED ORDER — MAGNESIUM SULFATE 40 G IN LACTATED RINGERS - SIMPLE
2.0000 g/h | INTRAVENOUS | Status: AC
  Administered 2012-02-23: 2 g/h via INTRAVENOUS
  Filled 2012-02-22 (×2): qty 500

## 2012-02-22 MED ORDER — MORPHINE SULFATE (PF) 0.5 MG/ML IJ SOLN
INTRAMUSCULAR | Status: DC | PRN
  Administered 2012-02-22: .15 mg via EPIDURAL

## 2012-02-22 MED ORDER — OXYTOCIN 40 UNITS IN LACTATED RINGERS INFUSION - SIMPLE MED
62.5000 mL/h | INTRAVENOUS | Status: AC
  Filled 2012-02-22: qty 1000

## 2012-02-22 MED ORDER — FENTANYL CITRATE 0.05 MG/ML IJ SOLN
25.0000 ug | INTRAMUSCULAR | Status: DC | PRN
  Administered 2012-02-22 (×3): 50 ug via INTRAVENOUS

## 2012-02-22 MED ORDER — WITCH HAZEL-GLYCERIN EX PADS: 1.0000 "application " | MEDICATED_PAD | CUTANEOUS | Status: DC | PRN

## 2012-02-22 MED ORDER — LACTATED RINGERS IV SOLN
INTRAVENOUS | Status: DC
  Administered 2012-02-22: 10:00:00 via INTRAVENOUS

## 2012-02-22 MED ORDER — PHENYLEPHRINE HCL 10 MG/ML IJ SOLN
INTRAMUSCULAR | Status: DC | PRN
  Administered 2012-02-22: 40 ug via INTRAVENOUS
  Administered 2012-02-22 (×2): 80 ug via INTRAVENOUS
  Administered 2012-02-22: 40 ug via INTRAVENOUS
  Administered 2012-02-22 (×2): 80 ug via INTRAVENOUS
  Administered 2012-02-22: 40 ug via INTRAVENOUS
  Administered 2012-02-22: 80 ug via INTRAVENOUS
  Administered 2012-02-22: 40 ug via INTRAVENOUS

## 2012-02-22 MED ORDER — KETOROLAC TROMETHAMINE 30 MG/ML IJ SOLN: 30.0000 mg | Freq: Four times a day (QID) | INTRAMUSCULAR | Status: AC | PRN

## 2012-02-22 MED ORDER — LANOLIN HYDROUS EX OINT: 1.0000 "application " | TOPICAL_OINTMENT | CUTANEOUS | Status: DC | PRN

## 2012-02-22 MED ORDER — FENTANYL CITRATE 0.05 MG/ML IJ SOLN
INTRAMUSCULAR | Status: AC
  Administered 2012-02-22: 50 ug via INTRAVENOUS
  Filled 2012-02-22: qty 2

## 2012-02-22 MED ORDER — LABETALOL HCL 5 MG/ML IV SOLN
INTRAVENOUS | Status: AC
  Administered 2012-02-22: 40 mg
  Filled 2012-02-22: qty 8

## 2012-02-22 MED ORDER — DIPHENHYDRAMINE HCL 50 MG/ML IJ SOLN: 25.0000 mg | INTRAMUSCULAR | Status: DC | PRN

## 2012-02-22 MED ORDER — ONDANSETRON HCL 4 MG/2ML IJ SOLN: 4.0000 mg | Freq: Three times a day (TID) | INTRAMUSCULAR | Status: DC | PRN

## 2012-02-22 MED ORDER — SIMETHICONE 80 MG PO CHEW
80.0000 mg | CHEWABLE_TABLET | Freq: Three times a day (TID) | ORAL | Status: DC
  Administered 2012-02-22 – 2012-02-25 (×10): 80 mg via ORAL

## 2012-02-22 MED ORDER — NALOXONE HCL 0.4 MG/ML IJ SOLN: 0.4000 mg | INTRAMUSCULAR | Status: DC | PRN

## 2012-02-22 MED ORDER — OXYCODONE-ACETAMINOPHEN 5-325 MG PO TABS
1.0000 | ORAL_TABLET | ORAL | Status: DC | PRN
  Administered 2012-02-23 (×5): 2 via ORAL
  Administered 2012-02-24: 1 via ORAL
  Administered 2012-02-24: 2 via ORAL
  Administered 2012-02-24: 1 via ORAL
  Administered 2012-02-25: 2 via ORAL
  Filled 2012-02-22: qty 1
  Filled 2012-02-22 (×7): qty 2
  Filled 2012-02-22: qty 1

## 2012-02-22 MED ORDER — LACTATED RINGERS IV SOLN
INTRAVENOUS | Status: DC
  Administered 2012-02-23: 07:00:00 via INTRAVENOUS

## 2012-02-22 MED ORDER — BUPIVACAINE IN DEXTROSE 0.75-8.25 % IT SOLN
INTRATHECAL | Status: DC | PRN
  Administered 2012-02-22: 1.6 mL via INTRATHECAL

## 2012-02-22 MED ORDER — MAGNESIUM SULFATE BOLUS VIA INFUSION
4.0000 g | INTRAVENOUS | Status: AC
  Administered 2012-02-22: 4 g via INTRAVENOUS
  Filled 2012-02-22: qty 500

## 2012-02-22 MED ORDER — CEFAZOLIN SODIUM-DEXTROSE 2-3 GM-% IV SOLR
2.0000 g | Freq: Once | INTRAVENOUS | Status: AC
  Administered 2012-02-22: 2 g via INTRAVENOUS

## 2012-02-22 MED ORDER — ONDANSETRON HCL 4 MG/2ML IJ SOLN: 4.0000 mg | INTRAMUSCULAR | Status: DC | PRN

## 2012-02-22 MED ORDER — DIBUCAINE 1 % RE OINT: 1.0000 "application " | TOPICAL_OINTMENT | RECTAL | Status: DC | PRN

## 2012-02-22 MED ORDER — SENNOSIDES-DOCUSATE SODIUM 8.6-50 MG PO TABS
2.0000 | ORAL_TABLET | Freq: Every day | ORAL | Status: DC
  Administered 2012-02-22 – 2012-02-24 (×3): 2 via ORAL

## 2012-02-22 MED ORDER — IBUPROFEN 600 MG PO TABS
600.0000 mg | ORAL_TABLET | Freq: Four times a day (QID) | ORAL | Status: DC | PRN
  Filled 2012-02-22 (×8): qty 1

## 2012-02-22 MED ORDER — OXYTOCIN 10 UNIT/ML IJ SOLN
40.0000 [IU] | INTRAVENOUS | Status: DC | PRN
  Administered 2012-02-22: 40 [IU] via INTRAVENOUS

## 2012-02-22 MED ORDER — ONDANSETRON HCL 4 MG/2ML IJ SOLN
INTRAMUSCULAR | Status: DC | PRN
  Administered 2012-02-22: 4 mg via INTRAVENOUS

## 2012-02-22 MED ORDER — NALBUPHINE HCL 10 MG/ML IJ SOLN
5.0000 mg | INTRAMUSCULAR | Status: DC | PRN
  Filled 2012-02-22: qty 1

## 2012-02-22 MED ORDER — LABETALOL HCL 5 MG/ML IV SOLN
20.0000 mg | Freq: Once | INTRAVENOUS | Status: AC
  Administered 2012-02-22: 20 mg via INTRAVENOUS
  Filled 2012-02-22: qty 4

## 2012-02-22 MED ORDER — ONDANSETRON HCL 4 MG PO TABS: 4.0000 mg | ORAL_TABLET | ORAL | Status: DC | PRN

## 2012-02-22 MED ORDER — SIMETHICONE 80 MG PO CHEW: 80.0000 mg | CHEWABLE_TABLET | ORAL | Status: DC | PRN

## 2012-02-22 MED ORDER — DIPHENHYDRAMINE HCL 50 MG/ML IJ SOLN: 12.5000 mg | INTRAMUSCULAR | Status: DC | PRN

## 2012-02-22 MED ORDER — PRENATAL MULTIVITAMIN CH
1.0000 | ORAL_TABLET | Freq: Every day | ORAL | Status: DC
  Administered 2012-02-23 – 2012-02-25 (×3): 1 via ORAL
  Filled 2012-02-22 (×3): qty 1

## 2012-02-22 MED ORDER — HYDROMORPHONE HCL PF 1 MG/ML IJ SOLN
INTRAMUSCULAR | Status: DC | PRN
  Administered 2012-02-22: 1 mg via INTRAVENOUS

## 2012-02-22 MED ORDER — NALOXONE HCL 1 MG/ML IJ SOLN: 1.0000 ug/kg/h | INTRAVENOUS | Status: DC | PRN

## 2012-02-22 MED ORDER — DIPHENHYDRAMINE HCL 25 MG PO CAPS: 25.0000 mg | ORAL_CAPSULE | Freq: Four times a day (QID) | ORAL | Status: DC | PRN

## 2012-02-22 MED ORDER — METOCLOPRAMIDE HCL 5 MG/ML IJ SOLN: 10.0000 mg | Freq: Three times a day (TID) | INTRAMUSCULAR | Status: DC | PRN

## 2012-02-22 MED ORDER — IBUPROFEN 600 MG PO TABS
600.0000 mg | ORAL_TABLET | Freq: Four times a day (QID) | ORAL | Status: DC
  Administered 2012-02-22 – 2012-02-25 (×9): 600 mg via ORAL
  Filled 2012-02-22 (×2): qty 1

## 2012-02-22 MED ORDER — SODIUM CHLORIDE 0.9 % IJ SOLN: 3.0000 mL | INTRAMUSCULAR | Status: DC | PRN

## 2012-02-22 MED ORDER — LACTATED RINGERS IV SOLN
INTRAVENOUS | Status: DC | PRN
  Administered 2012-02-22: 13:00:00 via INTRAVENOUS

## 2012-02-22 MED ORDER — FENTANYL CITRATE 0.05 MG/ML IJ SOLN
INTRAMUSCULAR | Status: DC | PRN
  Administered 2012-02-22: 25 ug via INTRATHECAL

## 2012-02-22 MED ORDER — FENTANYL CITRATE 0.05 MG/ML IJ SOLN
100.0000 ug | Freq: Once | INTRAMUSCULAR | Status: AC
  Administered 2012-02-22: 100 ug via INTRAVENOUS
  Filled 2012-02-22: qty 2

## 2012-02-22 MED ORDER — LABETALOL HCL 5 MG/ML IV SOLN: 20.0000 mg | INTRAVENOUS | Status: DC | PRN

## 2012-02-22 MED ORDER — MENTHOL 3 MG MT LOZG: 1.0000 | LOZENGE | OROMUCOSAL | Status: DC | PRN

## 2012-02-22 MED ORDER — LABETALOL HCL 5 MG/ML IV SOLN
INTRAVENOUS | Status: AC
  Administered 2012-02-22: 80 mg
  Filled 2012-02-22: qty 16

## 2012-02-22 MED ORDER — FENTANYL CITRATE 0.05 MG/ML IJ SOLN
INTRAMUSCULAR | Status: AC
  Filled 2012-02-22: qty 2

## 2012-02-22 MED ORDER — HYDROMORPHONE HCL PF 1 MG/ML IJ SOLN
1.0000 mg | Freq: Once | INTRAMUSCULAR | Status: AC
Start: 2012-02-22 — End: 2012-02-22
  Administered 2012-02-22: 1 mg via INTRAVENOUS
  Filled 2012-02-22 (×2): qty 1

## 2012-02-22 MED ORDER — CEFAZOLIN SODIUM-DEXTROSE 2-3 GM-% IV SOLR
INTRAVENOUS | Status: AC
  Filled 2012-02-22: qty 50

## 2012-02-22 MED ORDER — CITRIC ACID-SODIUM CITRATE 334-500 MG/5ML PO SOLN
30.0000 mL | Freq: Once | ORAL | Status: AC
  Administered 2012-02-22: 30 mL via ORAL
  Filled 2012-02-22: qty 15

## 2012-02-22 MED ORDER — MAGNESIUM SULFATE 40 G IN LACTATED RINGERS - SIMPLE
INTRAVENOUS | Status: DC | PRN
  Administered 2012-02-22: 2 g/h via INTRAVENOUS

## 2012-02-22 MED ORDER — MEPERIDINE HCL 25 MG/ML IJ SOLN: 6.2500 mg | INTRAMUSCULAR | Status: DC | PRN

## 2012-02-22 MED ORDER — KETOROLAC TROMETHAMINE 30 MG/ML IJ SOLN
30.0000 mg | Freq: Four times a day (QID) | INTRAMUSCULAR | Status: AC | PRN
  Administered 2012-02-22: 30 mg via INTRAVENOUS

## 2012-02-22 MED ORDER — SCOPOLAMINE 1 MG/3DAYS TD PT72
1.0000 | MEDICATED_PATCH | Freq: Once | TRANSDERMAL | Status: AC
  Administered 2012-02-22: 1.5 mg via TRANSDERMAL

## 2012-02-22 MED ORDER — LABETALOL HCL 5 MG/ML IV SOLN: 80.0000 mg | Freq: Once | INTRAVENOUS | Status: DC

## 2012-02-22 MED ORDER — DIPHENHYDRAMINE HCL 25 MG PO CAPS: 25.0000 mg | ORAL_CAPSULE | ORAL | Status: DC | PRN

## 2012-02-22 MED ORDER — ZOLPIDEM TARTRATE 5 MG PO TABS: 5.0000 mg | ORAL_TABLET | Freq: Every evening | ORAL | Status: DC | PRN

## 2012-02-22 MED ORDER — LABETALOL HCL 5 MG/ML IV SOLN: 40.0000 mg | Freq: Once | INTRAVENOUS | Status: DC

## 2012-02-22 MED ORDER — TETANUS-DIPHTH-ACELL PERTUSSIS 5-2.5-18.5 LF-MCG/0.5 IM SUSP
0.5000 mL | Freq: Once | INTRAMUSCULAR | Status: AC
  Administered 2012-02-23: 0.5 mL via INTRAMUSCULAR
  Filled 2012-02-22: qty 0.5

## 2012-02-22 SURGICAL SUPPLY — 36 items
BINDER ABD UNIV 12 45-62 (WOUND CARE) ×2 IMPLANT
BINDER ABDOMINAL 46IN 62IN (WOUND CARE) ×3
CLOTH BEACON ORANGE TIMEOUT ST (SAFETY) ×3 IMPLANT
DERMABOND ADVANCED (GAUZE/BANDAGES/DRESSINGS) ×2
DERMABOND ADVANCED .7 DNX12 (GAUZE/BANDAGES/DRESSINGS) ×4 IMPLANT
DRSG COVADERM 4X10 (GAUZE/BANDAGES/DRESSINGS) ×3 IMPLANT
DRSG OPSITE POSTOP 4X10 (GAUZE/BANDAGES/DRESSINGS) IMPLANT
DURAPREP 26ML APPLICATOR (WOUND CARE) ×6 IMPLANT
ELECT REM PT RETURN 9FT ADLT (ELECTROSURGICAL) ×3
ELECTRODE REM PT RTRN 9FT ADLT (ELECTROSURGICAL) ×2 IMPLANT
EXTRACTOR VACUUM BELL STYLE (SUCTIONS) ×3 IMPLANT
GLOVE BIOGEL PI IND STRL 8 (GLOVE) ×4 IMPLANT
GLOVE BIOGEL PI INDICATOR 8 (GLOVE) ×2
GLOVE ECLIPSE 8.0 STRL XLNG CF (GLOVE) ×3 IMPLANT
GLOVE SURG SS PI 7.5 STRL IVOR (GLOVE) ×6 IMPLANT
KIT ABG SYR 3ML LUER SLIP (SYRINGE) ×3 IMPLANT
NEEDLE HYPO 25X5/8 SAFETYGLIDE (NEEDLE) ×3 IMPLANT
NS IRRIG 1000ML POUR BTL (IV SOLUTION) ×3 IMPLANT
PACK C SECTION WH (CUSTOM PROCEDURE TRAY) ×3 IMPLANT
PAD ABD 7.5X8 STRL (GAUZE/BANDAGES/DRESSINGS) ×3 IMPLANT
PAD OB MATERNITY 4.3X12.25 (PERSONAL CARE ITEMS) ×3 IMPLANT
RTRCTR C-SECT PINK 25CM LRG (MISCELLANEOUS) IMPLANT
SLEEVE SCD COMPRESS KNEE MED (MISCELLANEOUS) ×3 IMPLANT
STAPLER VISISTAT 35W (STAPLE) ×3 IMPLANT
SUT CHROMIC 0 CT 1 (SUTURE) ×6 IMPLANT
SUT MNCRL 0 VIOLET CTX 36 (SUTURE) ×6 IMPLANT
SUT MONOCRYL 0 CTX 36 (SUTURE) ×3
SUT PLAIN 2 0 (SUTURE) ×2
SUT PLAIN 2 0 XLH (SUTURE) IMPLANT
SUT PLAIN ABS 2-0 CT1 27XMFL (SUTURE) ×4 IMPLANT
SUT VIC AB 0 CTX 36 (SUTURE) ×1
SUT VIC AB 0 CTX36XBRD ANBCTRL (SUTURE) ×2 IMPLANT
SUT VIC AB 4-0 KS 27 (SUTURE) IMPLANT
TOWEL OR 17X24 6PK STRL BLUE (TOWEL DISPOSABLE) ×6 IMPLANT
TRAY FOLEY CATH 14FR (SET/KITS/TRAYS/PACK) IMPLANT
WATER STERILE IRR 1000ML POUR (IV SOLUTION) ×3 IMPLANT

## 2012-02-22 NOTE — Progress Notes (Signed)
Pt received from PACU. 

## 2012-02-22 NOTE — Anesthesia Preprocedure Evaluation (Signed)
Anesthesia Evaluation  Patient identified by MRN, date of birth, ID band Patient awake    Reviewed: Allergy & Precautions, H&P , NPO status , Patient's Chart, lab work & pertinent test results  Airway Mallampati: III TM Distance: >3 FB Neck ROM: Full    Dental No notable dental hx. (+) Teeth Intact   Pulmonary neg pulmonary ROS,  breath sounds clear to auscultation  Pulmonary exam normal       Cardiovascular hypertension, Pt. on medications Rhythm:Regular Rate:Normal     Neuro/Psych  Headaches, PSYCHIATRIC DISORDERS Depression    GI/Hepatic negative GI ROS, Neg liver ROS,   Endo/Other  diabetes, Well Controlled, GestationalMorbid obesity  Renal/GU negative Renal ROS  negative genitourinary   Musculoskeletal negative musculoskeletal ROS (+)   Abdominal (+) + obese,   Peds  Hematology negative hematology ROS (+)   Anesthesia Other Findings   Reproductive/Obstetrics Previous C/Section x3 Pelvic Adhesions                           Anesthesia Physical Anesthesia Plan  ASA: III and emergent  Anesthesia Plan: Epidural, Spinal and Combined Spinal and Epidural   Post-op Pain Management:    Induction:   Airway Management Planned: Natural Airway  Additional Equipment:   Intra-op Plan:   Post-operative Plan:   Informed Consent: I have reviewed the patients History and Physical, chart, labs and discussed the procedure including the risks, benefits and alternatives for the proposed anesthesia with the patient or authorized representative who has indicated his/her understanding and acceptance.   Dental advisory given  Plan Discussed with: CRNA, Anesthesiologist and Surgeon  Anesthesia Plan Comments:         Anesthesia Quick Evaluation

## 2012-02-22 NOTE — H&P (Signed)
History     CSN: [id number]  Arrival date and time: 02/22/12 4098   None     Chief Complaint  Patient presents with  . Abdominal Pain   HPI Brianna Reeves is a 26 y.o. [phone] female at [redacted]w[redacted]d by LMP which correlates well w/ 18wk u/s, who presents w/ report of constant bilateral temporal ha, seeing spots, and constant shooting abdominal pain coming from bilateral sides to center of abdomen and shooting down to pelvis x 2 days. Can hear heart beat in Lt ear.  Was seen in mau 2 days ago w/ report of pelvic pain- d/c'd w/ 10 percocet.  Has been taking for abdominal pain and ha w/ minimal relief- has taken all of pills.   Reports good fm.  Denies VB.  States lost mucous plug 2 days ago, and then had gush of watery mucous last night- none since.  Holter monitor d/c'd on 12/5- was wearing for 'fainting spells'. Previous c/s for contracted pelvis and pre-e x 3.  Has CHTN, no current meds.  Pre-e labs on 11/6 wnl, 24hr urine protein 10. U/S on 12/5 AFI 20.48, cephalic presentation. Late onset of care @ [facility] @ [phone]. Failed 1hr glucola, passed 3hr.  Normal quad screen.  Normal anatomy scan but suboptimal views.   OB History    Grav Para Term Preterm Abortions TAB SAB Ect Mult Living   4 3 3  0      3      Past Medical History  Diagnosis Date  . Anemia   . Potassium (K) deficiency   . Headache   . Pregnancy induced hypertension     all preg  . Gestational diabetes     all  . Urinary tract infection   . Depression     doing good    Past Surgical History  Procedure Date  . Cesarean section   . Tonsillectomy   . Cholecystectomy   . Cesarean section 10/12/2010    Procedure: CESAREAN SECTION;  Surgeon: Lazaro Arms, MD;  Location: [facility];  Service: Gynecology;  Laterality: N/A;  Repeat Cesarean Section with Delivery Baby Boy @ 1756; Apgars 7/9  . Cesarean section 10/12/2010    Procedure: CESAREAN SECTION;  Surgeon: Lazaro Arms, MD;  Location: [facility];  Service: Gynecology;   Laterality: N/A;    Family History  Problem Relation Age of Onset  . Hypertension Mother   . Diabetes Mother   . Hypertension Father   . Diabetes Father   . Hypertension Maternal Grandmother   . Diabetes Maternal Grandmother   . Early death Sister     Died age 41 ("natural cause")    History  Substance Use Topics  . Smoking status: Former Smoker    Types: Cigarettes  . Smokeless tobacco: Never Used     Comment: 2012  . Alcohol Use: No    Allergies:  Allergies  Allergen Reactions  . Vicodin (Hydrocodone-Acetaminophen) Shortness Of Breath and Swelling    Prescriptions prior to admission  Medication Sig Dispense Refill  . ferrous sulfate 325 (65 FE) MG tablet Take 1 tablet (325 mg total) by mouth daily.  30 tablet  3  . oxyCODONE-acetaminophen (PERCOCET/ROXICET) 5-325 MG per tablet Take 1 tablet by mouth every 4 (four) hours as needed for pain.  10 tablet  0  . potassium chloride SA (K-DUR,KLOR-CON) 20 MEQ tablet Take 2 tablets (40 mEq total) by mouth daily.  4 tablet  0  . Fe Fum-FePoly-FA-Vit C-Vit B3 (  INTEGRA F) 125-1 MG CAPS Take 1 tablet by mouth daily.  30 capsule  1  . Misc. Devices (TRACTION PELVIC BELT) MISC 1 Units by Does not apply route once.  1 each  0    Review of Systems  Constitutional: Negative.   Eyes:       Seeing spots x 2 days   Respiratory: Negative.   Cardiovascular: Negative.   Gastrointestinal: Positive for abdominal pain. Negative for nausea and vomiting.  Genitourinary: Negative.   Musculoskeletal: Negative.   Skin: Negative.   Neurological: Positive for headaches.  Endo/Heme/Allergies: Negative.   Psychiatric/Behavioral: Positive for depression (h/o depression, no SI currently).   Physical Exam   Blood pressure 168/125, pulse 104, temperature 98.2 F (36.8 C), resp. rate 18, last menstrual period 06/21/2011.  Physical Exam  Constitutional: She is oriented to person, place, and time. She appears well-developed and well-nourished.   HENT:  Head: Normocephalic.  Neck: Normal range of motion.  Cardiovascular: Normal rate and regular rhythm.   Respiratory: Effort normal and breath sounds normal.  GI: Soft. She exhibits no distension. There is tenderness (to mid and lower abdomen/pelvis).       Gravid   Genitourinary: Vagina normal and uterus normal.       Spec exam: mod amt thick white non-odorous d/c, no pooling of fluid SVE: LTC  Musculoskeletal: Normal range of motion.  Neurological: She is alert and oriented to person, place, and time. She has normal reflexes.       No clonus   Skin: Skin is warm and dry.  Psychiatric: She has a normal mood and affect. Her behavior is normal. Judgment and thought content normal.   FHR: 145, mod variability, 15x15 accels, no decels= Cat I UCs: mild, irregular- lot of artifact on efm  MAU Course  Procedures  NST IV Labetalol IV Fentanyl iv CBC, CMP, urine P/C ratio: pending Mag 4gm bolus, then 2gm/hr GBS Wet prep SVE: LTC  Notified dr. Despina Hidden of bp's- decision for c/s  Assessment and Plan  A:  [redacted]w[redacted]d SIUP  CHTN w/ superimposed severe range HTN/pre-eclampsia (labs pending)  Prev c/s x3  Cat I FHR  Late onset prenatal care @ 21.6wks    P:  Emergent C/S for severe range BPs/pre-eclampsia  Currently receiving Labetalol and magnesium iv  Patient in hypertensive crisis in the severe range.  Currently giving labetalol exponential dosing protocol, will switch to hydralazine if needed, proceed with caesarean section, previous op note reviewed, severe adhesive disease.  I did her last section.  Magnesium started, foley to be placed.  Type and screen to be done.  Dr Malen Gauze, anaesthesia, directly consulted and communicated with. [name] 02/22/2012, 9:33 AM

## 2012-02-22 NOTE — Anesthesia Postprocedure Evaluation (Signed)
  Anesthesia Post-op Note  Patient: Brianna Reeves  Procedure(s) Performed: Procedure(s) (LRB) with comments: CESAREAN SECTION (N/A) - with bilateral tubal ligation  BILATERAL TUBAL LIGATION ()  Patient Location: PACU  Anesthesia Type:Spinal  Level of Consciousness: awake, alert  and oriented  Airway and Oxygen Therapy: Patient Spontanous Breathing  Post-op Pain: none  Post-op Assessment: Post-op Vital signs reviewed, Patient's Cardiovascular Status Stable, Respiratory Function Stable, Patent Airway, No signs of Nausea or vomiting, Pain level controlled, No headache, No backache, No residual numbness and No residual motor weakness  Post-op Vital Signs: Reviewed and stable  Complications: No apparent anesthesia complications

## 2012-02-22 NOTE — MAU Note (Signed)
Pt presents to MAU via EMS with complaints of abdominal pain and mucous discharge. Pt is a G4P3 at [redacted]w[redacted]d- prior history of C-section times 3. [facility]. Pt desribes the pain as constant, mid abdomen and shoots down to her pelvic area. Pt was seen in MAU 2 days ago.

## 2012-02-22 NOTE — Op Note (Signed)
Preoperative diagnosis:  1.  Intrauterine pregnancy at 35 1/[redacted] weeks gestation                                         2.  severe preeclampsia, with blood pressure 240/200                                         3.  history of preeclampsia with 3 previous pregnancies                                         4.  Previous cesarean section x3                                         5.  Desired tubal ligation(patient actually wanted her tubes tied with her last C-section and I performed her C-section.  However due to the severe adhesions of her anterior.wall to the uterus I was unable to perform a tubal ligation.  The patient got pregnant again before having one performed and she related her desire to have permanent sterilization performed again this time.  She states she had not signed her tubal papers but if I could she did 1 it performed.  Because of the notes a left myself from her last C-section I was able to take a different surgical approach liberated her uterus and performed the tubal.)   Postoperative diagnosis:  Same as above plus severe pelvic abdominal adhesive disease  Procedure:  Repeat cesarean section, classical  Surgeon:  Lazaro Arms MD  Assistant:    Anesthesia: Spinal  Findings:  The patient presented to the maternity admission unit with an initial blood pressure of 160/120.  She was complaining of a headache for the last 3 days as well as seeing spots.  She has a history of chronic hypertension but has not been on medication and her blood pressures this pregnancy have been outstanding, 110/70-130/80 range.  The patient's blood pressure quickly arose to a max of 240/200.  We were giving her labetalol throughout this time, initially 20 mg then 40 then 80.  After the 80 mg dose her blood pressure came down a bit more than I wanted to to 120/80.  As a result of her CNS symptoms and her severe range blood pressures I elected to proceed with a repeat cesarean section.  A red my operative  note from 10/12/2010 and noted the severe adhesions of her lower uterine segment to the anterior bowel wall.  I was unable to perform a tubal ligation at the time of her last surgery.  I discussed with the patient preoperatively doing a vertical uterine incision above, if I could, the severe adhesions of the uterus and  lower uterine segment to the anterior bowel wall.  I was successful in doing this and so proceeded at her request with a tubal ligation bilaterally.  Over a low transverse incision was delivered a viable female with Apgars of 7 and 8 weighing  lbs.  oz. Uterus, tubes and ovaries were all normal.  There were no other significant findings except for the  extensive pelvic abdominal adhesive disease.  Description of operation:  Patient was taken to the operating room and placed in the sitting position where she underwent a spinal anesthetic. She was then placed in the supine position with tilt to the left side. When adequate anesthetic level was obtained she was prepped and draped in usual sterile fashion and a Foley catheter was placed in the MAU. A Pfannenstiel skin incision was made and carried down sharply to the rectus fascia which was scored in the midline extended laterally. The fascia was taken off the muscles both superiorly with great difficulty.  I had elected to stay high above the low uterine segment adhesions because of the note a left myself from her last cesarean section.  The muscles were divided I was able to find a window into the peritoneum and from above to down the anterior abdominal wall rectus adhesions to the uterus both sharply and with electrocautery.  The standard the adhesions which probably 8 x 8 cm .  I did not really take down the adhesions below the pubis because I elected to do a vertical skin incision.  I did a classical cesarean section and used to vacuum extractor to perform  the delivery. female  infant at 43 with Apgars of 8 and 8 weighing lbs  oz.  Cord pH was  obtained and was 7.19.   Of note  From the time of in skin incision to the time of delivery was 18 minutes.  The uterus was exteriorized. It was closed in 4 layers, the first being a running interlocking layer and the rest being an imbricating layer using 0 monocryl on a CTX needle. There was good resulting hemostasis.  The uterus had to be sutured additionally N. figure-of-eight's in the area of the adhesions.  A modified Pomeroy bilateral tubal ligation was performed bilaterally and both tubal segments were sent to pathology for evaluation.   The uterus tubes and ovaries were all normal. Peritoneal cavity was irrigated vigorously. The muscles and peritoneum were reapproximated loosely. The fascia was closed using 0 Vicryl in running fashion. Subcutaneous tissue was made hemostatic and irrigated. The skin was closed using skin staples.   Blood loss for the procedure was 650 cc. The patient received a 2 gram of Ancef prophylactically. The patient was taken to the recovery room in good stable condition with all counts being correct x3.  EBL 650 cc  EURE,LUTHER H 02/22/2012 2:19 PM

## 2012-02-22 NOTE — Transfer of Care (Signed)
Immediate Anesthesia Transfer of Care Note  Patient: Brianna Reeves  Procedure(s) Performed: Procedure(s) (LRB) with comments: CESAREAN SECTION (N/A) - with bilateral tubal ligation  BILATERAL TUBAL LIGATION ()  Patient Location: PACU  Anesthesia Type:Spinal  Level of Consciousness: awake, alert  and oriented  Airway & Oxygen Therapy: Patient Spontanous Breathing  Post-op Assessment: Report given to PACU RN and Post -op Vital signs reviewed and stable  Post vital signs: stable  Complications: No apparent anesthesia complications

## 2012-02-22 NOTE — MAU Provider Note (Signed)
History     CSN: [id number]  Arrival date and time: 02/22/12 1308   None     Chief Complaint  Patient presents with  . Abdominal Pain   HPI Brianna Reeves is a 26 y.o. [phone] female at [redacted]w[redacted]d by LMP which correlates well w/ 18wk u/s, who presents w/ report of constant bilateral temporal ha, seeing spots, and constant shooting abdominal pain coming from bilateral sides to center of abdomen and shooting down to pelvis x 2 days. Can hear heart beat in Lt ear.  Was seen in mau 2 days ago w/ report of pelvic pain- d/c'd w/ 10 percocet.  Has been taking for abdominal pain and ha w/ minimal relief- has taken all of pills.   Reports good fm.  Denies VB.  States lost mucous plug 2 days ago, and then had gush of watery mucous last night- none since.  Holter monitor d/c'd on 12/5- was wearing for 'fainting spells'. Previous c/s for contracted pelvis and pre-e x 3.  Has CHTN, no current meds.  Pre-e labs on 11/6 wnl, 24hr urine protein 10. U/S on 12/5 AFI 20.48, cephalic presentation. Late onset of care @ [facility] @ [phone]. Failed 1hr glucola, passed 3hr.  Normal quad screen.  Normal anatomy scan but suboptimal views.   OB History    Grav Para Term Preterm Abortions TAB SAB Ect Mult Living   4 3 3  0      3      Past Medical History  Diagnosis Date  . Anemia   . Potassium (K) deficiency   . Headache   . Pregnancy induced hypertension     all preg  . Gestational diabetes     all  . Urinary tract infection   . Depression     doing good    Past Surgical History  Procedure Date  . Cesarean section   . Tonsillectomy   . Cholecystectomy   . Cesarean section 10/12/2010    Procedure: CESAREAN SECTION;  Surgeon: Lazaro Arms, MD;  Location: [facility];  Service: Gynecology;  Laterality: N/A;  Repeat Cesarean Section with Delivery Baby Boy @ 1756; Apgars 7/9  . Cesarean section 10/12/2010    Procedure: CESAREAN SECTION;  Surgeon: Lazaro Arms, MD;  Location: [facility];  Service: Gynecology;   Laterality: N/A;    Family History  Problem Relation Age of Onset  . Hypertension Mother   . Diabetes Mother   . Hypertension Father   . Diabetes Father   . Hypertension Maternal Grandmother   . Diabetes Maternal Grandmother   . Early death Sister     Died age 22 ("natural cause")    History  Substance Use Topics  . Smoking status: Former Smoker    Types: Cigarettes  . Smokeless tobacco: Never Used     Comment: 2012  . Alcohol Use: No    Allergies:  Allergies  Allergen Reactions  . Vicodin (Hydrocodone-Acetaminophen) Shortness Of Breath and Swelling    Prescriptions prior to admission  Medication Sig Dispense Refill  . ferrous sulfate 325 (65 FE) MG tablet Take 1 tablet (325 mg total) by mouth daily.  30 tablet  3  . oxyCODONE-acetaminophen (PERCOCET/ROXICET) 5-325 MG per tablet Take 1 tablet by mouth every 4 (four) hours as needed for pain.  10 tablet  0  . potassium chloride SA (K-DUR,KLOR-CON) 20 MEQ tablet Take 2 tablets (40 mEq total) by mouth daily.  4 tablet  0  . Fe Fum-FePoly-FA-Vit C-Vit B3 (INTEGRA  F) 125-1 MG CAPS Take 1 tablet by mouth daily.  30 capsule  1  . Misc. Devices (TRACTION PELVIC BELT) MISC 1 Units by Does not apply route once.  1 each  0    Review of Systems  Constitutional: Negative.   Eyes:       Seeing spots x 2 days   Respiratory: Negative.   Cardiovascular: Negative.   Gastrointestinal: Positive for abdominal pain. Negative for nausea and vomiting.  Genitourinary: Negative.   Musculoskeletal: Negative.   Skin: Negative.   Neurological: Positive for headaches.  Endo/Heme/Allergies: Negative.   Psychiatric/Behavioral: Positive for depression (h/o depression, no SI currently).   Physical Exam   Blood pressure 168/125, pulse 104, temperature 98.2 F (36.8 C), resp. rate 18, last menstrual period 06/21/2011.  Physical Exam  Constitutional: She is oriented to person, place, and time. She appears well-developed and well-nourished.   HENT:  Head: Normocephalic.  Neck: Normal range of motion.  Cardiovascular: Normal rate and regular rhythm.   Respiratory: Effort normal and breath sounds normal.  GI: Soft. She exhibits no distension. There is tenderness (to mid and lower abdomen/pelvis).       Gravid   Genitourinary: Vagina normal and uterus normal.       Spec exam: mod amt thick white non-odorous d/c, no pooling of fluid SVE: LTC  Musculoskeletal: Normal range of motion.  Neurological: She is alert and oriented to person, place, and time. She has normal reflexes.       No clonus   Skin: Skin is warm and dry.  Psychiatric: She has a normal mood and affect. Her behavior is normal. Judgment and thought content normal.   FHR: 145, mod variability, 15x15 accels, no decels= Cat I UCs: mild, irregular- lot of artifact on efm  MAU Course  Procedures  NST IV Labetalol IV Fentanyl iv CBC, CMP, urine P/C ratio: pending Mag 4gm bolus, then 2gm/hr GBS Wet prep SVE: LTC  Notified dr. Despina Hidden of bp's- decision for c/s  Assessment and Plan  A:  [redacted]w[redacted]d SIUP  CHTN w/ superimposed severe range HTN/pre-eclampsia (labs pending)  Prev c/s x3  Cat I FHR  Late onset prenatal care @ 21.6wks    P:  Emergent C/S for severe range BPs/pre-eclampsia  Currently receiving Labetalol and magnesium iv   [name] 02/22/2012, 9:33 AM

## 2012-02-22 NOTE — Anesthesia Procedure Notes (Signed)
Spinal  Patient location during procedure: OR Start time: 02/22/2012 12:43 PM Staffing Anesthesiologist: Quanisha Drewry A. Preanesthetic Checklist Completed: patient identified, site marked, surgical consent, pre-op evaluation, timeout performed, IV checked, risks and benefits discussed and monitors and equipment checked Spinal Block Patient position: sitting Prep: site prepped and draped and DuraPrep Patient monitoring: heart rate, cardiac monitor, continuous pulse ox and blood pressure Approach: midline Location: L3-4 Injection technique: single-shot Needle Needle type: Sprotte  Needle gauge: 24 G Needle length: 9 cm Needle insertion depth: 68 cm Assessment Sensory level: T4 Additional Notes Patient tolerated procedure well. Adequate sensory level.

## 2012-02-23 LAB — CBC
HCT: 27.8 % — ABNORMAL LOW (ref 36.0–46.0)
Hemoglobin: 8.5 g/dL — ABNORMAL LOW (ref 12.0–15.0)
MCV: 74.3 fL — ABNORMAL LOW (ref 78.0–100.0)
RDW: 16.1 % — ABNORMAL HIGH (ref 11.5–15.5)
WBC: 8.6 10*3/uL (ref 4.0–10.5)

## 2012-02-23 NOTE — Anesthesia Postprocedure Evaluation (Signed)
  Anesthesia Post-op Note  Patient: Brianna Reeves  Procedure(s) Performed: Procedure(s) (LRB) with comments: CESAREAN SECTION (N/A) - with bilateral tubal ligation  BILATERAL TUBAL LIGATION ()  Patient Location: PACU and A-ICU  Anesthesia Type:Epidural  Level of Consciousness: awake, alert  and oriented  Airway and Oxygen Therapy: Patient Spontanous Breathing    Post-op Assessment: Patient's Cardiovascular Status Stable and Respiratory Function Stable  Post-op Vital Signs: stable  Complications: No apparent anesthesia complications

## 2012-02-23 NOTE — Progress Notes (Signed)
02/23/12 1700  Vitals  BP 110/63 mmHg  MAP (mmHg) 50   Pulse Rate 80    Pt remains unchanged.

## 2012-02-23 NOTE — Progress Notes (Signed)
Transferred to Women's Unit via W/C

## 2012-02-23 NOTE — Progress Notes (Signed)
pts mother, father, sister and niece here to visit.

## 2012-02-23 NOTE — Addendum Note (Signed)
Addendum  created 02/23/12 1518 by Len Blalock, CRNA   Modules edited:Notes Section

## 2012-02-23 NOTE — Progress Notes (Signed)
Subjective: Postpartum Day 1: Cesarean Delivery Patient reports feeling good.  No complaints, some headache  Objective: Vital signs in last 24 hours: Temp:  [97.4 F (36.3 C)-98.4 F (36.9 C)] 98.1 F (36.7 C) (12/08 0742) Pulse Rate:  [68-112] 80  (12/08 0700) Resp:  [16-23] 16  (12/08 0700) BP: (97-243)/(36-205) 122/71 mmHg (12/08 0700) SpO2:  [94 %-100 %] 98 % (12/08 0700) Weight:  [244 lb (110.678 kg)-244 lb 9.6 oz (110.95 kg)] 244 lb (110.678 kg) (12/07 1700) I/O last 3 completed shifts: In: 4600.4 [P.O.:2020; I.V.:2580.4] Out: 3925 [Urine:3225; Blood:700] Total I/O In: -  Out: 300 [Urine:300]   Physical Exam:  General: alert, cooperative and no distress Lochia: appropriate Uterine Fundus: firm Incision: dressing DVT Evaluation: No evidence of DVT seen on physical exam.   Basename 02/23/12 0505 02/22/12 1018  HGB 8.5* 9.1*  HCT 27.8* 29.6*    Assessment/Plan: Status post Cesarean section. Postoperative course complicated by pre eclampsia  Continue current care. Magnesium to be stopped this am EURE,LUTHER H 02/23/2012, 7:55 AM

## 2012-02-23 NOTE — Progress Notes (Signed)
Paged by nursing. Pt with several low BP measurements after ambulating to NICU; systolic down to low 80's, diastolic as low as 45. One measurement 91/67. UOP remains adequate, pt otherwise asymptomatic without dizziness, nausea, etc. Will continue to monitor BP's and UOP.  Bobbye Morton, MD PGY-1, [facility]
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
## 2012-02-23 NOTE — Progress Notes (Signed)
02/23/12 1619 02/23/12 1620 02/23/12 1623  Vitals  BP 92/61 mmHg ! 83/45 mmHg ! 86/48 mmHg  MAP (mmHg) 69  52  57   BP Location Left arm Left arm Right arm  BP Method Manual Manual Manual  Patient Position, if appropriate Sitting (in chair-just returned from NICU) Sitting Sitting  Pulse Rate 90  83  82    Dr. Casper Harrison made aware. Pt asymptomatic, sitting up in chair eating her dinner. Instructed her to push po fluids. Will monitor closely and notify MD is continued hypotension.

## 2012-02-24 ENCOUNTER — Encounter (HOSPITAL_COMMUNITY): Payer: Self-pay | Admitting: Obstetrics & Gynecology

## 2012-02-24 ENCOUNTER — Encounter: Payer: Self-pay | Admitting: Obstetrics and Gynecology

## 2012-02-24 LAB — CULTURE, BETA STREP (GROUP B ONLY)

## 2012-02-24 NOTE — Progress Notes (Signed)
Subjective: Postpartum Day 2: Cesarean Delivery Patient reports incisional pain, tolerating PO, + flatus and no problems voiding.    Objective: Vital signs in last 24 hours: Temp:  [97.8 F (36.6 C)-98.7 F (37.1 C)] 97.8 F (36.6 C) (12/09 0536) Pulse Rate:  [77-91] 84  (12/09 0536) Resp:  [16-18] 18  (12/09 0536) BP: (83-169)/(45-86) 169/75 mmHg (12/09 0536) SpO2:  [96 %-100 %] 98 % (12/09 0536) Weight:  [112.038 kg (247 lb)] 112.038 kg (247 lb) (12/09 0536)  Physical Exam:  General: alert, cooperative and no distress Lochia: appropriate, scant Uterine Fundus: firm, -1 Abdominal binder in place at this time Incision: no significant drainage DVT Evaluation: No evidence of DVT seen on physical exam. Negative Homan's sign. No cords or calf tenderness. No significant calf/ankle edema.   Basename 02/23/12 0505 02/22/12 1018  HGB 8.5* 9.1*  HCT 27.8* 29.6*    Assessment/Plan: Status post Cesarean section. Doing well postoperatively.  Continue current care.  LEFTWICH-KIRBY, Jalen Oberry 02/24/2012, 7:31 AM

## 2012-02-25 LAB — BASIC METABOLIC PANEL
CO2: 24 mEq/L (ref 19–32)
Calcium: 8.6 mg/dL (ref 8.4–10.5)
GFR calc non Af Amer: >90 mL/min (ref >90)
Glucose, Bld: 80 mg/dL (ref 70–99)
Potassium: 3 mEq/L — ABNORMAL LOW (ref 3.5–5.1)
Sodium: 137 mEq/L (ref 135–145)

## 2012-02-25 MED ORDER — IBUPROFEN 600 MG PO TABS: 600.0000 mg | ORAL_TABLET | Freq: Four times a day (QID) | ORAL | Status: DC

## 2012-02-25 MED ORDER — OXYCODONE-ACETAMINOPHEN 5-325 MG PO TABS: 1.0000 | ORAL_TABLET | ORAL | Status: DC | PRN

## 2012-02-25 MED ORDER — DOCUSATE SODIUM 100 MG PO CAPS: 100.0000 mg | ORAL_CAPSULE | Freq: Two times a day (BID) | ORAL | Status: DC | PRN

## 2012-02-25 MED ORDER — POTASSIUM CHLORIDE CRYS ER 20 MEQ PO TBCR
40.0000 meq | EXTENDED_RELEASE_TABLET | Freq: Every day | ORAL | Status: DC
  Administered 2012-02-25: 40 meq via ORAL
  Filled 2012-02-25 (×2): qty 2

## 2012-02-25 NOTE — Progress Notes (Signed)
Pt ambulated out appointment made for staple removal  In clinic  Pt informed to call if b/p  Changes and increases

## 2012-02-25 NOTE — Progress Notes (Signed)
UR completed 

## 2012-02-25 NOTE — Discharge Summary (Signed)
Obstetric Discharge Summary  Brianna Reeves is a 26 y.o. [phone] who presented at [redacted]w[redacted]d with symptomatic hypertensive crisis with BP elevated in severe range, headache and scotomata. She was treated as presumed severe preeclampsia superimposed on chronic hypertension for which she had NOT been taking any medications during pregnancy. With a history of 3 prior c-sections, she was taken for emergent repeat c-section with BTL. She was maintained on magnesium sulfate for 24 hours post-partum, and her blood pressures were well controlled postpartum requiring no medication.  Patient has history of chronic hypokalemia of unknown cause (since at least 2012). She has been on KCl supplement during pregnancy, which was stopped during her hospital stay. Potassium on day of discharge was 3.0. Her potassium was restarted. She will need to follow up her potassium levels as outpatient.   Reason for Admission: severe preeclampsia superimposed on chronic HTN, 3 prior cesarean sections Prenatal Procedures: Preeclampsia Intrapartum Procedures: cesarean: classical and tubal ligation Postpartum Procedures: Magnesium for 24 hours Complications-Operative and Postpartum: none Hemoglobin  Date Value Range Status  02/23/2012 8.5* 12.0 - 15.0 g/dL Final  06/17/3242 01.0   Final     HCT  Date Value Range Status  02/23/2012 27.8* 36.0 - 46.0 % Final  10/21/2011 34   Final    Physical Exam:  General: alert, cooperative and no distress Lochia: appropriate Uterine Fundus: firm Incision: healing well, no significant drainage, no dehiscence, no significant erythema DVT Evaluation: No evidence of DVT seen on physical exam. No significant calf/ankle edema.  Results for orders placed during the hospital encounter of 02/22/12 (from the past 24 hour(s))  BASIC METABOLIC PANEL     Status: Abnormal   Collection Time   02/25/12  8:04 AM      Component Value Range   Sodium 137  135 - 145 mEq/L   Potassium 3.0 (*) 3.5 - 5.1  mEq/L   Chloride 101  96 - 112 mEq/L   CO2 24  19 - 32 mEq/L   Glucose, Bld 80  70 - 99 mg/dL   BUN 8  6 - 23 mg/dL   Creatinine, Ser 2.72  0.50 - 1.10 mg/dL   Calcium 8.6  8.4 - 53.6 mg/dL   GFR calc non Af Amer >90  >90 mL/min   GFR calc Af Amer >90  >90 mL/min    Discharge Diagnoses: Preterm delivery by repeat cesarean section, Chronic hypertension with superimposed severe preeclampsia, hypokalemia, anemia  Discharge Information: Date: 02/25/2012 Activity: pelvic rest Diet: routine Medications: Ibuprofen, Colace, Iron, Percocet and KCl Condition: stable Instructions: refer to practice specific booklet Discharge to: home Follow-up Information    Follow up with [facility]. Schedule an appointment as soon as possible for a visit in 5 weeks. (Make appt for 4 to 6 weeks postpartum. Call or return to ED  as needed if symptoms worsen)    Contact information:   [address] [place] [address] [phone]         Newborn Data: Live born female  Birth Weight: 5 lb 12.1 oz (2610 g) APGAR: 7, 8  Baby remains in NICU.  Napoleon Form 02/25/2012, 9:04 AM

## 2012-02-25 NOTE — Clinical Social Work Psychosocial (Signed)
Clinical Social Work Department PSYCHOSOCIAL ASSESSMENT - MATERNAL/CHILD 02/25/2012  Patient:  Brianna Reeves, Brianna Reeves  Account Number:  [id number]  Admit Date:  02/22/2012  Marjo Bicker Name:   Brianna Reeves    Clinical Social Worker:  Vennie Homans, [place]   Date/Time:  02/24/2012 11:00 AM  Date Referred:  02/24/2012   Referral source  CSW     Referred reason  Depression/Anxiety  NICU   Other referral source:    I:  FAMILY / HOME ENVIRONMENT Child's legal guardian:  PARENT  Guardian - Name Guardian - Age Guardian - Address  Brianna Reeves 26 [address] [place], [place] [address]  Brianna Reeves  same   Other household support members/support persons Name Relationship DOB  Zyanna SISTER 5 yo  Zharra SISTER 2yo  [place] 1yo   Other support:    II  PSYCHOSOCIAL DATA Information Source:  Family Interview  Financial and [employer] Employment:   FOB works on cars. MOB stays at home with children.   Financial resources:  Medicaid If Medicaid - County:  [employer] / Grade:   Maternity Care Coordinator / Child Services Coordination / Early Interventions:   MOB has MCC, but has found them unresponsive.  Cultural issues impacting care:   none noted    III  STRENGTHS Strengths  Compliance with medical plan  Understanding of illness   Strength comment:    IV  RISK FACTORS AND CURRENT PROBLEMS Current Problem:  YES   Risk Factor & Current Problem Patient Issue Family Issue Risk Factor / Current Problem Comment  Financial Resources N N MOB trying to obtain baby items, needs baby.    V  SOCIAL WORK ASSESSMENT CSW met with MOB in her pp room. MOB reports to have a h/o depression. She was prescribed medication in the past, but did not feel comfortable taking it. MOB described mild depression and manages this currently on her own. CSW discussed other counseling options available in the community. MOB agrees to seek assistance as needed and  reach out to CSW for support.  MOB described some financial concerns and has been trying to obtain items for this baby. She reports she still needs a bed, but thinks she has a carseat.      VI SOCIAL WORK PLAN Social Work Plan  Information/Referral to [employer]  Psychosocial Support/Ongoing Assessment of Needs   Type of pt/family education:   If child protective services report - county:   If child protective services report - date:   Information/referral to community resources comment:   [facility] the [place]  possible [employer]   Other social work plan:   CSW to follow and reassess needs due to h/o depression.     Doreen Salvage, LCSW  Coverning NICU for [facility] M-F 8am-12pm

## 2012-02-26 ENCOUNTER — Other Ambulatory Visit: Payer: Medicaid Other

## 2012-02-26 LAB — TYPE AND SCREEN
ABO/RH(D): O POS
Antibody Screen: NEGATIVE
Unit division: 0

## 2012-02-27 LAB — RPR: RPR Ser Ql: NONREACTIVE

## 2012-03-02 ENCOUNTER — Ambulatory Visit (INDEPENDENT_AMBULATORY_CARE_PROVIDER_SITE_OTHER): Payer: Medicaid Other | Admitting: *Deleted

## 2012-03-02 VITALS — BP 137/85 | HR 68 | Temp 99.0°F | Ht 66.0 in | Wt 237.8 lb

## 2012-03-02 DIAGNOSIS — Z09 Encounter for follow-up examination after completed treatment for conditions other than malignant neoplasm: Secondary | ICD-10-CM

## 2012-03-02 NOTE — Progress Notes (Signed)
Patient in for staple removal. Incision clean and dry. Staples removed in every other fashion, steri-strips applied. Pt tolerated well. No signs of infections.

## 2012-03-25 ENCOUNTER — Encounter: Payer: Self-pay | Admitting: *Deleted

## 2012-03-25 ENCOUNTER — Ambulatory Visit: Payer: Medicaid Other | Admitting: Obstetrics and Gynecology

## 2012-04-24 ENCOUNTER — Encounter (HOSPITAL_COMMUNITY): Payer: Self-pay | Admitting: Emergency Medicine

## 2012-04-24 ENCOUNTER — Emergency Department (INDEPENDENT_AMBULATORY_CARE_PROVIDER_SITE_OTHER)
Admission: EM | Admit: 2012-04-24 | Discharge: 2012-04-24 | Disposition: A | Discharge status: Home / Self Care | Payer: Medicaid Other | Source: Home / Self Care | Attending: Family Medicine | Admitting: Family Medicine

## 2012-04-24 DIAGNOSIS — H5460 Unqualified visual loss, one eye, unspecified: Secondary | ICD-10-CM

## 2012-04-24 DIAGNOSIS — H5462 Unqualified visual loss, left eye, normal vision right eye: Secondary | ICD-10-CM

## 2012-04-24 NOTE — ED Notes (Signed)
Pt is here for loss of vision of left eye x4 days Reports she can't see but blurry objects This has happened in the past and Opthalmologist gave her eye drops; lasted for 3 weeks and had resolved on its own Sx today include: headache on left side; sensitive to light and noise, blurry vision Hx of HTN and family hx of loss of vision  She is alert and responsive w/no signs of acute distress.

## 2012-04-24 NOTE — ED Provider Notes (Signed)
History     CSN: [id number]  Arrival date & time 04/24/12  1215   First MD Initiated Contact with Patient 04/24/12 1245      Chief Complaint  Patient presents with  . Loss of Vision    (Consider location/radiation/quality/duration/timing/severity/associated sxs/prior treatment) Patient is a 27 y.o. female presenting with eye problem. The history is provided by the patient.  Eye Problem  This is a recurrent problem. The current episode started more than 2 days ago. The problem has been gradually worsening. The left eye is affected.There was no injury mechanism. The pain is mild. There is no history of trauma to the eye. There is no known exposure to pink eye. Associated symptoms include blurred vision and decreased vision. Pertinent negatives include no numbness, no discharge, no double vision and no photophobia. Associated symptoms comments: Pt reports pos fhx of blindness.. Treatments tried: seen at [facility] 02/2010 with similar right eye sx, dx'd fictitious visual loss, inconsistent with nl exam.    Past Medical History  Diagnosis Date  . Anemia   . Potassium (K) deficiency   . Headache   . Pregnancy induced hypertension     all preg  . Gestational diabetes     all  . Urinary tract infection   . Depression     doing good    Past Surgical History  Procedure Date  . Cesarean section   . Tonsillectomy   . Cholecystectomy   . Cesarean section 10/12/2010    Procedure: CESAREAN SECTION;  Surgeon: Lazaro Arms, MD;  Location: [facility];  Service: Gynecology;  Laterality: N/A;  Repeat Cesarean Section with Delivery Baby Boy @ 1756; Apgars 7/9  . Cesarean section 10/12/2010    Procedure: CESAREAN SECTION;  Surgeon: Lazaro Arms, MD;  Location: [facility];  Service: Gynecology;  Laterality: N/A;  . Cesarean section 02/22/2012    Procedure: CESAREAN SECTION;  Surgeon: Lazaro Arms, MD;  Location: [facility];  Service: Obstetrics;  Laterality: N/A;  with bilateral tubal ligation   .  Tubal ligation 02/22/2012    Procedure: BILATERAL TUBAL LIGATION;  Surgeon: Lazaro Arms, MD;  Location: [facility];  Service: Obstetrics;;    Family History  Problem Relation Age of Onset  . Hypertension Mother   . Diabetes Mother   . Hypertension Father   . Diabetes Father   . Hypertension Maternal Grandmother   . Diabetes Maternal Grandmother   . Early death Sister     Died age 52 ("natural cause")    History  Substance Use Topics  . Smoking status: Former Smoker    Types: Cigarettes  . Smokeless tobacco: Never Used     Comment: 2012  . Alcohol Use: No    OB History    Grav Para Term Preterm Abortions TAB SAB Ect Mult Living   4 4 3 1      4       Review of Systems  Constitutional: Negative.   Eyes: Positive for blurred vision and visual disturbance. Negative for double vision, photophobia and discharge.  Neurological: Positive for headaches. Negative for dizziness, speech difficulty, light-headedness and numbness.    Allergies  Vicodin  Home Medications   Current Outpatient Rx  Name  Route  Sig  Dispense  Refill  . DOCUSATE SODIUM 100 MG PO CAPS   Oral   Take 1 capsule (100 mg total) by mouth 2 (two) times daily as needed for constipation.   30 capsule   2   .  FERROUS SULFATE 325 (65 FE) MG PO TABS   Oral   Take 1 tablet (325 mg total) by mouth daily.   30 tablet   3   . IBUPROFEN 600 MG PO TABS   Oral   Take 1 tablet (600 mg total) by mouth every 6 (six) hours.   30 tablet   1   . OXYCODONE-ACETAMINOPHEN 5-325 MG PO TABS   Oral   Take 1-2 tablets by mouth every 4 (four) hours as needed (moderate - severe pain).   30 tablet   0   . POTASSIUM CHLORIDE CRYS ER 20 MEQ PO TBCR   Oral   Take 2 tablets (40 mEq total) by mouth daily.   4 tablet   0     BP 121/76  Pulse 79  Temp 99.2 F (37.3 C) (Oral)  Resp 18  SpO2 98%  LMP 03/29/2012  Physical Exam  Nursing note and vitals reviewed. Constitutional: She is oriented to person, place, and  time. She appears well-developed and well-nourished.  HENT:  Head: Normocephalic.  Right Ear: External ear normal.  Left Ear: External ear normal.  Mouth/Throat: Oropharynx is clear and moist.  Eyes: Conjunctivae normal are normal. Pupils are equal, round, and reactive to light.  Neck: Normal range of motion. Neck supple.  Lymphadenopathy:    She has no cervical adenopathy.  Neurological: She is alert and oriented to person, place, and time.  Skin: Skin is warm and dry.    ED Course  Procedures (including critical care time)  Labs Reviewed - No data to display No results found.   1. Visual loss, left eye       MDM          Linna Hoff, MD 04/24/12 1322

## 2012-05-12 ENCOUNTER — Other Ambulatory Visit: Payer: Self-pay | Admitting: Ophthalmology

## 2012-05-12 DIAGNOSIS — H547 Unspecified visual loss: Secondary | ICD-10-CM

## 2012-05-16 ENCOUNTER — Ambulatory Visit
Admission: RE | Admit: 2012-05-16 | Discharge: 2012-05-16 | Disposition: A | Discharge status: Home / Self Care | Payer: Medicaid Other | Source: Ambulatory Visit | Attending: Ophthalmology | Admitting: Ophthalmology

## 2012-05-16 ENCOUNTER — Inpatient Hospital Stay
Admission: RE | Admit: 2012-05-16 | Discharge: 2012-05-16 | Disposition: A | Discharge status: Home / Self Care | Payer: Medicaid Other | Source: Ambulatory Visit | Attending: Ophthalmology | Admitting: Ophthalmology

## 2012-05-16 DIAGNOSIS — H547 Unspecified visual loss: Secondary | ICD-10-CM

## 2012-05-16 DIAGNOSIS — H469 Unspecified optic neuritis: Secondary | ICD-10-CM

## 2012-05-16 MED ORDER — GADOBENATE DIMEGLUMINE 529 MG/ML IV SOLN
20.0000 mL | Freq: Once | INTRAVENOUS | Status: AC | PRN
  Administered 2012-05-16: 20 mL via INTRAVENOUS
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
## 2012-09-24 ENCOUNTER — Encounter (HOSPITAL_COMMUNITY): Payer: Self-pay | Admitting: Emergency Medicine

## 2012-09-24 ENCOUNTER — Emergency Department (HOSPITAL_COMMUNITY)
Admission: EM | Admit: 2012-09-24 | Discharge: 2012-09-24 | Disposition: A | Discharge status: Home / Self Care | Payer: Medicaid Other | Attending: Emergency Medicine | Admitting: Emergency Medicine

## 2012-09-24 DIAGNOSIS — Z8639 Personal history of other endocrine, nutritional and metabolic disease: Secondary | ICD-10-CM | POA: Insufficient documentation

## 2012-09-24 DIAGNOSIS — Z8742 Personal history of other diseases of the female genital tract: Secondary | ICD-10-CM | POA: Insufficient documentation

## 2012-09-24 DIAGNOSIS — R51 Headache: Secondary | ICD-10-CM | POA: Insufficient documentation

## 2012-09-24 DIAGNOSIS — Z79899 Other long term (current) drug therapy: Secondary | ICD-10-CM | POA: Insufficient documentation

## 2012-09-24 DIAGNOSIS — Z862 Personal history of diseases of the blood and blood-forming organs and certain disorders involving the immune mechanism: Secondary | ICD-10-CM | POA: Insufficient documentation

## 2012-09-24 DIAGNOSIS — Z87440 Personal history of urinary (tract) infections: Secondary | ICD-10-CM | POA: Insufficient documentation

## 2012-09-24 DIAGNOSIS — H53149 Visual discomfort, unspecified: Secondary | ICD-10-CM | POA: Insufficient documentation

## 2012-09-24 DIAGNOSIS — Z8632 Personal history of gestational diabetes: Secondary | ICD-10-CM | POA: Insufficient documentation

## 2012-09-24 DIAGNOSIS — Z791 Long term (current) use of non-steroidal anti-inflammatories (NSAID): Secondary | ICD-10-CM | POA: Insufficient documentation

## 2012-09-24 DIAGNOSIS — Z87891 Personal history of nicotine dependence: Secondary | ICD-10-CM | POA: Insufficient documentation

## 2012-09-24 DIAGNOSIS — G8929 Other chronic pain: Secondary | ICD-10-CM | POA: Insufficient documentation

## 2012-09-24 DIAGNOSIS — M549 Dorsalgia, unspecified: Secondary | ICD-10-CM | POA: Insufficient documentation

## 2012-09-24 MED ORDER — DIPHENHYDRAMINE HCL 50 MG/ML IJ SOLN
25.0000 mg | Freq: Once | INTRAMUSCULAR | Status: AC
  Administered 2012-09-24: 25 mg via INTRAMUSCULAR
  Filled 2012-09-24: qty 1

## 2012-09-24 MED ORDER — METHOCARBAMOL 500 MG PO TABS: 500.0000 mg | ORAL_TABLET | Freq: Two times a day (BID) | ORAL | Status: DC

## 2012-09-24 MED ORDER — KETOROLAC TROMETHAMINE 30 MG/ML IJ SOLN
30.0000 mg | Freq: Once | INTRAMUSCULAR | Status: AC
  Administered 2012-09-24: 30 mg via INTRAMUSCULAR
  Filled 2012-09-24: qty 1

## 2012-09-24 MED ORDER — METOCLOPRAMIDE HCL 5 MG/ML IJ SOLN
10.0000 mg | Freq: Once | INTRAMUSCULAR | Status: AC
  Administered 2012-09-24: 10 mg via INTRAMUSCULAR
  Filled 2012-09-24: qty 2

## 2012-09-24 MED ORDER — MELOXICAM 7.5 MG PO TABS: 15.0000 mg | ORAL_TABLET | Freq: Every day | ORAL | Status: DC
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
## 2012-09-24 NOTE — ED Notes (Signed)
Pt sleepy from the medications given. Pt states she has a friend to pick her up. Pt  Has phone in hand to call. Report given to RN. Pt may need to sleep some of the meds down.

## 2012-09-24 NOTE — ED Provider Notes (Signed)
Medical screening examination/treatment/procedure(s) were performed by non-physician practitioner and as supervising physician I was immediately available for consultation/collaboration.  Olivia Mackie, MD 09/24/12 [phone]

## 2013-01-01 IMAGING — US US OB COMP +14 WK
1 series · 12 of 28 positions shown · non-contrast
Comparison: none

[Series 1: us ob comp +14 wk · 54 acquisitions, 12 frames shown]
[im 2/54]
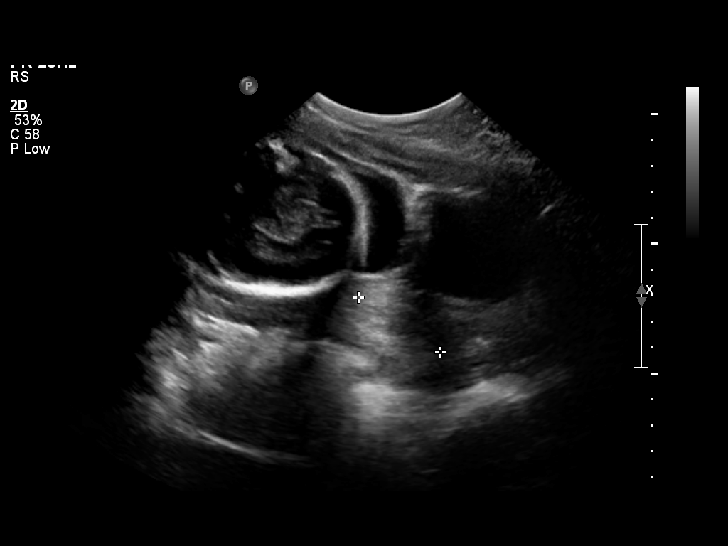
[im 6/54]
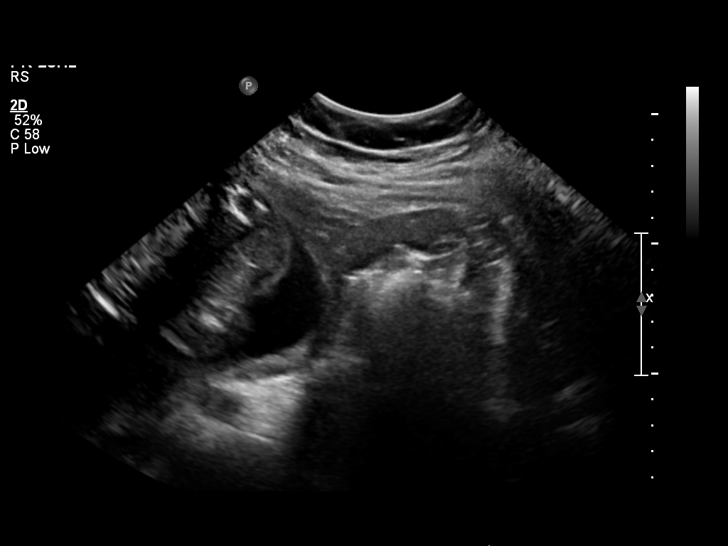
[im 10/54]
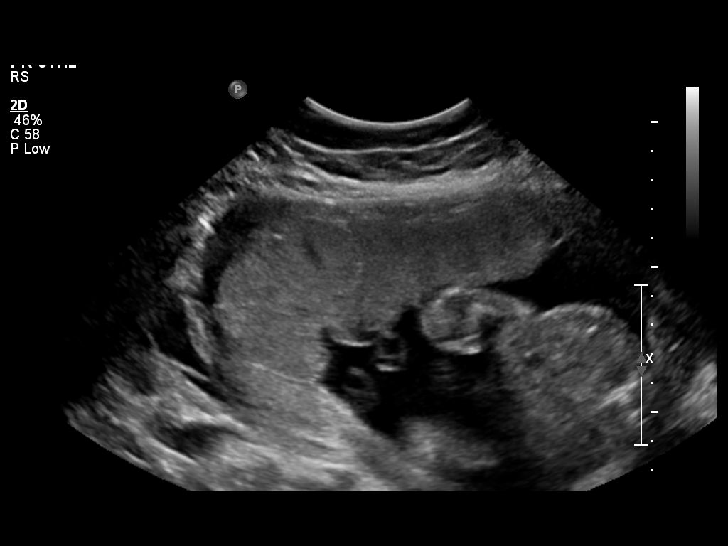
[im 16/54]
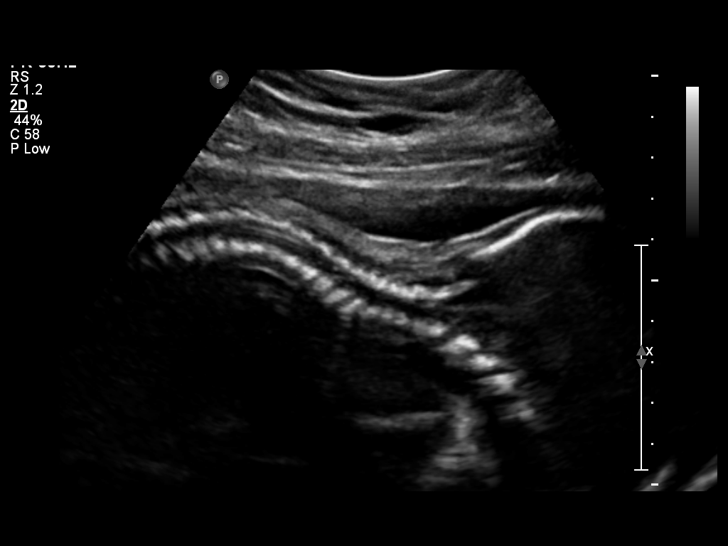
[im 20/54]
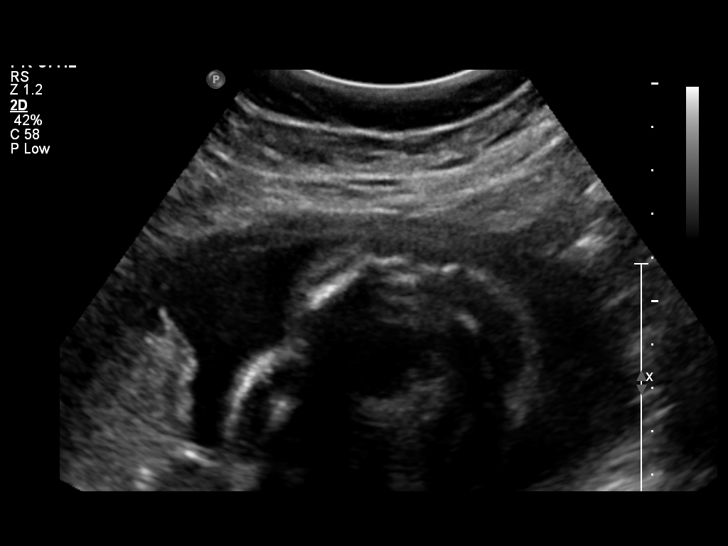
[im 24/54]
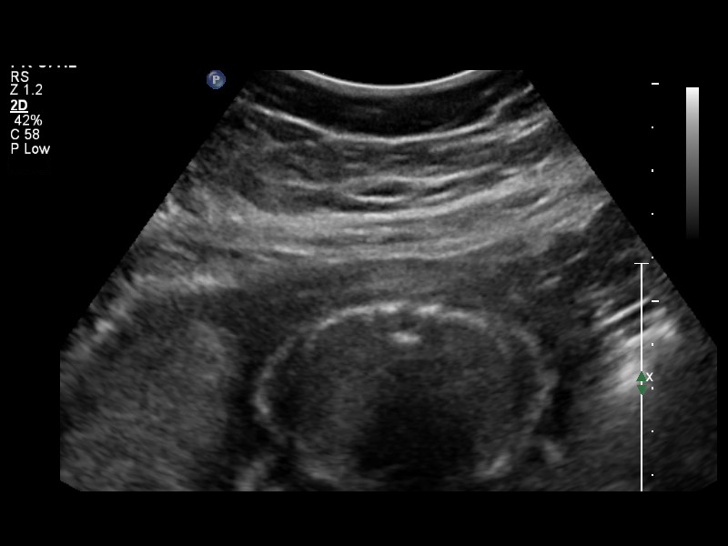
[im 30/54]
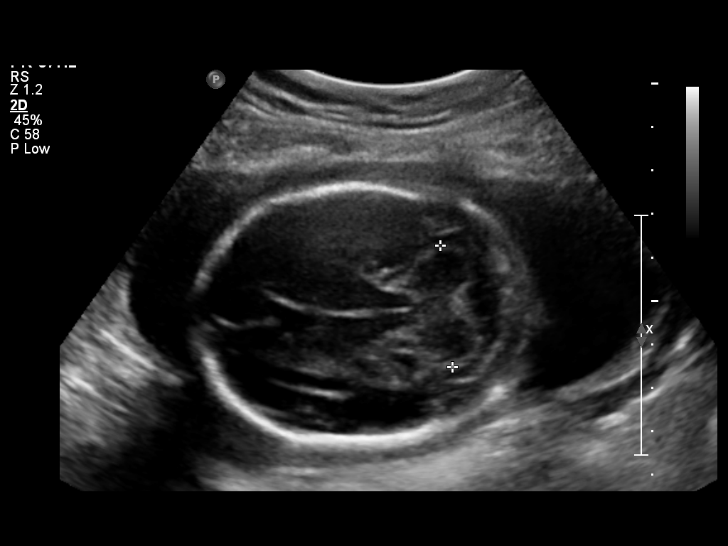
[im 34/54]
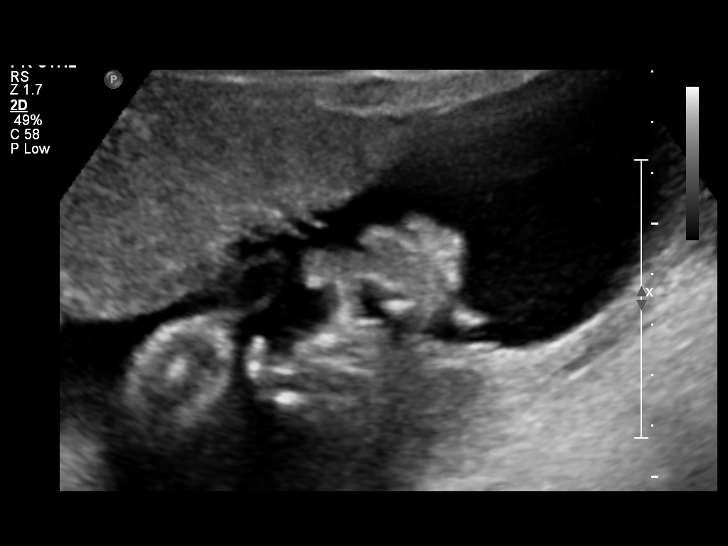
[im 38/54]
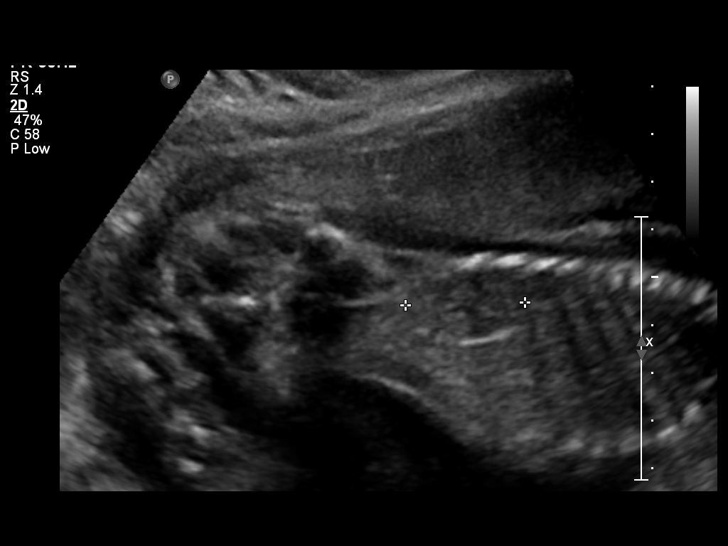
[im 44/54]
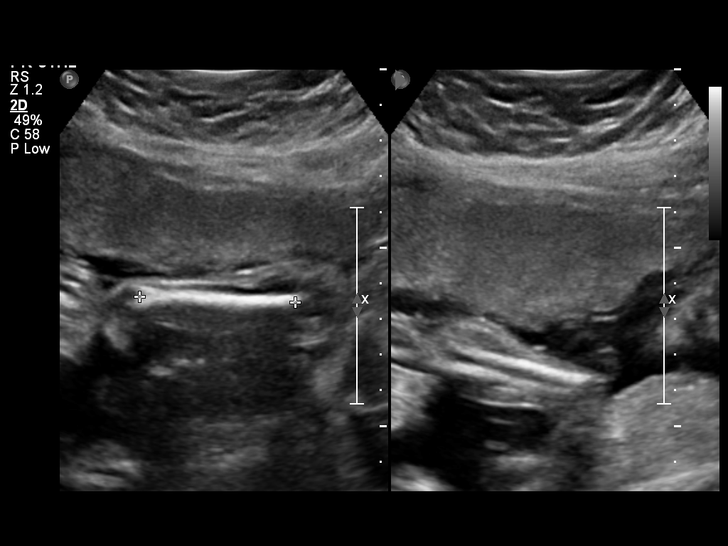
[im 48/54]
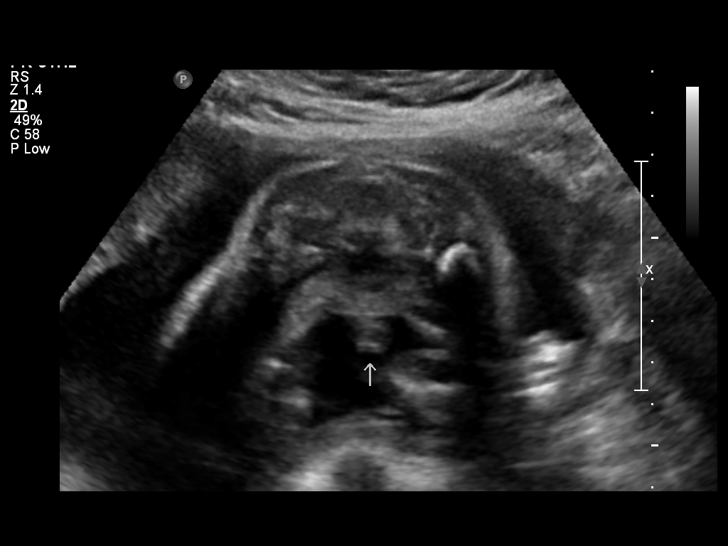
[im 52/54]
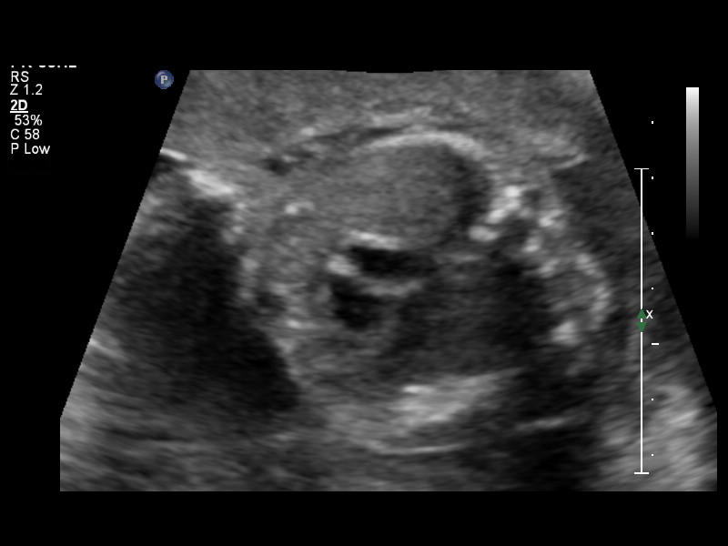

[12 of 28 positions shown; findings below may reference images not displayed]

OBSTETRICS REPORT
                      (Signed Final 07/10/2010 [DATE])

 Order#:         [id number]
Procedures

 US OB COMP + 14 WK                                    76805.1
Indications

 Basic anatomic survey
 Hypertension - Chronic/Pre-existing
Fetal Evaluation

 Fetal Heart Rate:  149                          bpm
 Cardiac Activity:  Observed
 Presentation:      Cephalic
 Placenta:          Anterior, above cervical os
 P. Cord            Visualized
 Insertion:

 Amniotic Fluid
 AFI FV:      Subjectively within normal limits
                                             Larg Pckt:     4.4  cm
Biometry

 BPD:     58.3  mm     G. Age:  23w 6d                CI:        68.17   70 - 86
                                                      FL/HC:      20.2   18.7 -

 HC:     225.8  mm     G. Age:  24w 4d       32  %    HC/AC:      1.14   1.04 -

 AC:     198.3  mm     G. Age:  24w 4d       39  %    FL/BPD:     78.2   71 - 87
 FL:      45.6  mm     G. Age:  25w 1d       54  %    FL/AC:      23.0   20 - 24
 HUM:     44.9  mm     G. Age:  26w 4d       92  %
 CER:       28  mm     G. Age:  25w 0d       61  %

 Est. FW:     724  gm    1 lb 10 oz      55  %
Gestational Age

 U/S Today:     24w 4d                                        EDD:   10/26/10
 Best:          24w 4d     Det. By:  U/S C R L (04/04/10)     EDD:   10/26/10
Anatomy

 Cranium:           Appears normal      Aortic Arch:       Basic anatomy
                                                           exam per order
 Fetal Cavum:       Appears normal      Ductal Arch:       Basic anatomy
                                                           exam per order
 Ventricles:        Appears normal      Diaphragm:         Basic anatomy
                                                           exam per order
 Choroid Plexus:    Appears normal      Stomach:           Appears normal
 Cerebellum:        Appears normal      Abdomen:           Appears normal
 Posterior Fossa:   Appears normal      Abdominal Wall:    Appears nml
                                                           (cord insert,
                                                           abd wall)
 Nuchal Fold:       Not applicable      Cord Vessels:      Appears normal
                    (>20 wks GA)                           (3 vessel cord)
 Face:              Lips appear         Kidneys:           Appear normal
                    normal
 Heart:             Appears normal      Bladder:           Appears normal
                    (4 chamber &
                    axis)
 RVOT:              Basic anatomy       Spine:             Appears normal
                    exam per order
 LVOT:              Basic anatomy       Limbs:             Four extremities
                    exam per order                         seen

 Other:     Fetus appears to be a male.
Cervix Uterus Adnexa

 Cervical Length:    3.7      cm

 Cervix:       Normal appearance by transabdominal scan.
 Left Ovary:    Within normal limits.
 Right Ovary:   Within normal limits.

 Adnexa:     No abnormality visualized.
Impression

 SIUP with an EGA by US of 24w 4d. Correlation with
 expected EGA by early ultrasound of 24w 4d  corresponds
 with appropriate growth.

 Visualized fetal anatomy appears normal. No focal placental
 abnormalities are noted.

 Subjectively and quantitatively normal amniotic fluid volume
 and normal cervical length.

## 2013-01-21 ENCOUNTER — Other Ambulatory Visit: Payer: Self-pay

## 2013-03-07 IMAGING — CR DG CHEST 2V
2 series · 2 of 2 positions shown · non-contrast
Comparison: 04/05/2009.

CLINICAL DATA: History of pregnancy with chest pain and mild
shortness of breath.  History of hypertension.

CHEST - 2 VIEW

[view not recorded (1 of 2)]
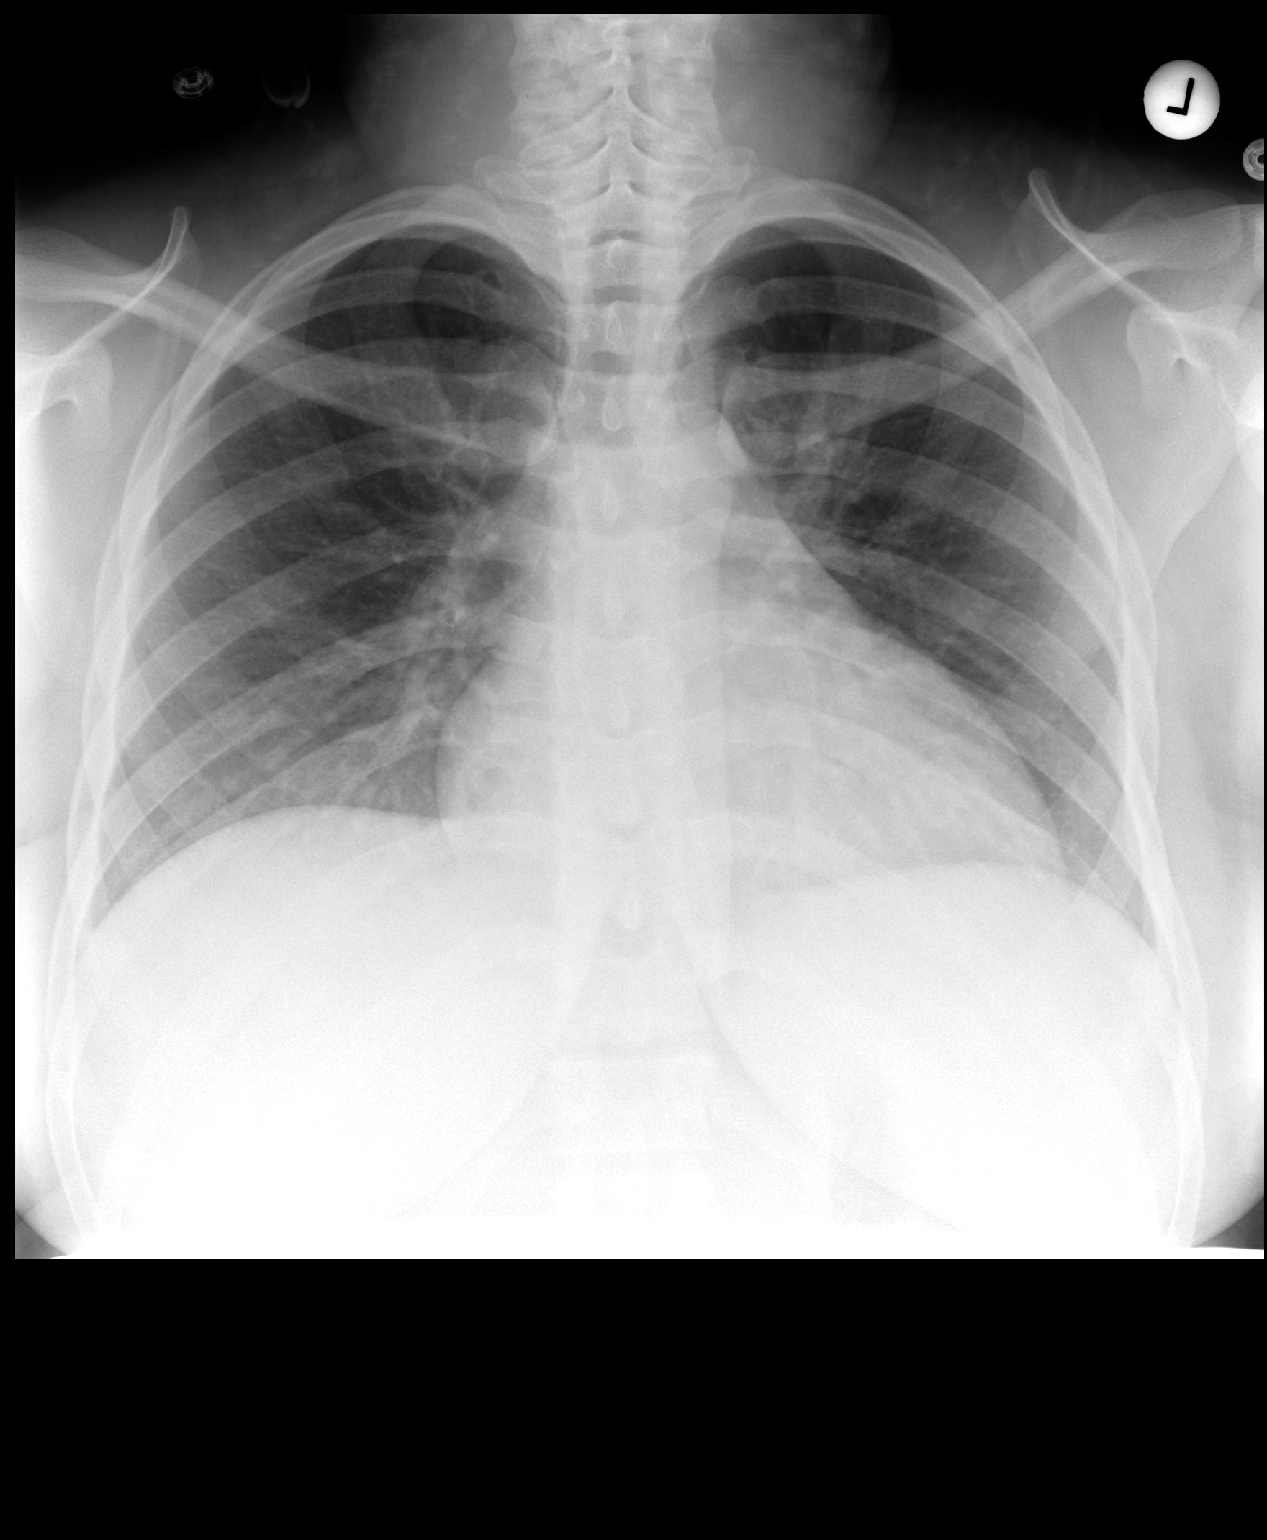

[view not recorded (2 of 2)]
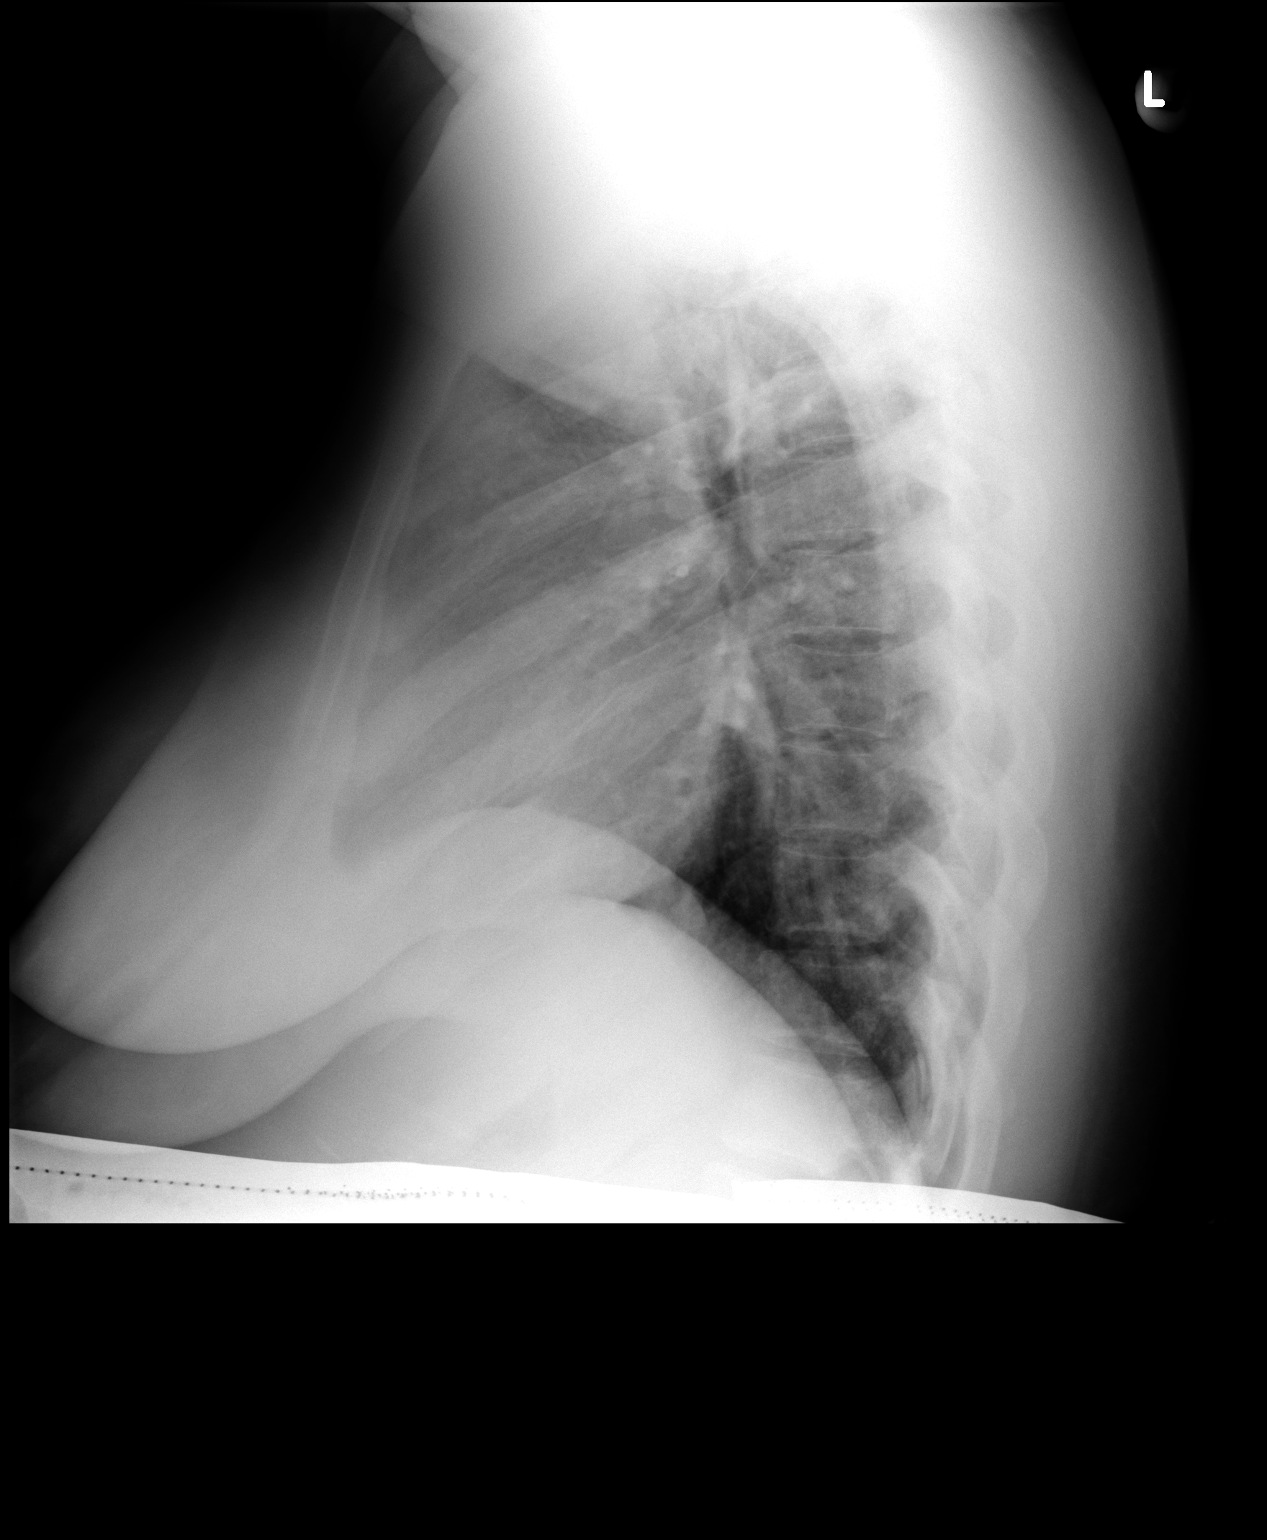

[2 of 2 positions shown; findings below may reference images not displayed]

FINDINGS: There is moderate enlargement cardiac silhouette.
History given of pregnancy. The lungs are well aerated and free of
infiltrates. No pleural abnormality is evident.  No pneumothorax.
Bones appear average for age.
IMPRESSION: Moderate enlargement of the  cardiac silhouette.  No pulmonary
edema, pneumonia, or pleural effusion.

## 2013-06-10 ENCOUNTER — Emergency Department (HOSPITAL_COMMUNITY): Payer: Medicaid Other

## 2013-06-10 ENCOUNTER — Emergency Department (HOSPITAL_COMMUNITY)
Admission: EM | Admit: 2013-06-10 | Discharge: 2013-06-10 | Disposition: A | Discharge status: Home / Self Care | Payer: Medicaid Other | Attending: Emergency Medicine | Admitting: Emergency Medicine

## 2013-06-10 ENCOUNTER — Encounter (HOSPITAL_COMMUNITY): Payer: Self-pay | Admitting: Emergency Medicine

## 2013-06-10 DIAGNOSIS — Z8632 Personal history of gestational diabetes: Secondary | ICD-10-CM | POA: Insufficient documentation

## 2013-06-10 DIAGNOSIS — Z8679 Personal history of other diseases of the circulatory system: Secondary | ICD-10-CM | POA: Insufficient documentation

## 2013-06-10 DIAGNOSIS — D649 Anemia, unspecified: Secondary | ICD-10-CM | POA: Insufficient documentation

## 2013-06-10 DIAGNOSIS — Z87440 Personal history of urinary (tract) infections: Secondary | ICD-10-CM | POA: Insufficient documentation

## 2013-06-10 DIAGNOSIS — Z3202 Encounter for pregnancy test, result negative: Secondary | ICD-10-CM | POA: Insufficient documentation

## 2013-06-10 DIAGNOSIS — R21 Rash and other nonspecific skin eruption: Secondary | ICD-10-CM | POA: Insufficient documentation

## 2013-06-10 DIAGNOSIS — Z8659 Personal history of other mental and behavioral disorders: Secondary | ICD-10-CM | POA: Insufficient documentation

## 2013-06-10 DIAGNOSIS — Z87891 Personal history of nicotine dependence: Secondary | ICD-10-CM | POA: Insufficient documentation

## 2013-06-10 DIAGNOSIS — E876 Hypokalemia: Secondary | ICD-10-CM | POA: Insufficient documentation

## 2013-06-10 DIAGNOSIS — A599 Trichomoniasis, unspecified: Secondary | ICD-10-CM | POA: Insufficient documentation

## 2013-06-10 DIAGNOSIS — Z79899 Other long term (current) drug therapy: Secondary | ICD-10-CM | POA: Insufficient documentation

## 2013-06-10 DIAGNOSIS — Z9851 Tubal ligation status: Secondary | ICD-10-CM | POA: Insufficient documentation

## 2013-06-10 DIAGNOSIS — Z9089 Acquired absence of other organs: Secondary | ICD-10-CM | POA: Insufficient documentation

## 2013-06-10 LAB — WET PREP, GENITAL: YEAST WET PREP: NONE SEEN

## 2013-06-10 LAB — URINALYSIS, ROUTINE W REFLEX MICROSCOPIC
BILIRUBIN URINE: NEGATIVE
Glucose, UA: NEGATIVE mg/dL
HGB URINE DIPSTICK: NEGATIVE
Ketones, ur: NEGATIVE mg/dL
NITRITE: NEGATIVE
PROTEIN: NEGATIVE mg/dL
UROBILINOGEN UA: 0.2 mg/dL (ref 0.0–1.0)
pH: 6 (ref 5.0–8.0)

## 2013-06-10 LAB — COMPREHENSIVE METABOLIC PANEL
ALT: 32 U/L (ref 0–35)
AST: 21 U/L (ref 0–37)
Albumin: 3.4 g/dL — ABNORMAL LOW (ref 3.5–5.2)
Alkaline Phosphatase: 140 U/L — ABNORMAL HIGH (ref 39–117)
BUN: 10 mg/dL (ref 6–23)
CALCIUM: 9.2 mg/dL (ref 8.4–10.5)
CO2: 25 meq/L (ref 19–32)
CREATININE: 0.89 mg/dL (ref 0.50–1.10)
Chloride: 102 mEq/L (ref 96–112)
GFR, EST NON AFRICAN AMERICAN: 89 mL/min — AB (ref >90)
GLUCOSE: 113 mg/dL — AB (ref 70–99)
Potassium: 3.8 mEq/L (ref 3.7–5.3)
Sodium: 140 mEq/L (ref 137–147)
TOTAL PROTEIN: 7.5 g/dL (ref 6.0–8.3)
Total Bilirubin: 0.2 mg/dL — ABNORMAL LOW (ref 0.3–1.2)

## 2013-06-10 LAB — CBC WITH DIFFERENTIAL/PLATELET
Basophils Absolute: 0 10*3/uL (ref 0.0–0.1)
Basophils Relative: 0 % (ref 0–1)
EOS ABS: 0.6 10*3/uL (ref 0.0–0.7)
EOS PCT: 8 % — AB (ref 0–5)
HEMATOCRIT: 36.9 % (ref 36.0–46.0)
HEMOGLOBIN: 12.2 g/dL (ref 12.0–15.0)
LYMPHS ABS: 2.9 10*3/uL (ref 0.7–4.0)
LYMPHS PCT: 41 % (ref 12–46)
MCH: 26.5 pg (ref 26.0–34.0)
MCHC: 33.1 g/dL (ref 30.0–36.0)
MCV: 80 fL (ref 78.0–100.0)
MONO ABS: 0.4 10*3/uL (ref 0.1–1.0)
MONOS PCT: 5 % (ref 3–12)
Neutro Abs: 3.3 10*3/uL (ref 1.7–7.7)
Neutrophils Relative %: 46 % (ref 43–77)
PLATELETS: 291 10*3/uL (ref 150–400)
RBC: 4.61 MIL/uL (ref 3.87–5.11)
RDW: 14.7 % (ref 11.5–15.5)
WBC: 7.2 10*3/uL (ref 4.0–10.5)

## 2013-06-10 LAB — URINE MICROSCOPIC-ADD ON

## 2013-06-10 LAB — POC URINE PREG, ED: PREG TEST UR: NEGATIVE

## 2013-06-10 LAB — HIV ANTIBODY (ROUTINE TESTING W REFLEX): HIV: NONREACTIVE

## 2013-06-10 MED ORDER — ONDANSETRON HCL 4 MG/2ML IJ SOLN
4.0000 mg | Freq: Once | INTRAMUSCULAR | Status: AC
  Administered 2013-06-10: 4 mg via INTRAVENOUS
  Filled 2013-06-10: qty 2

## 2013-06-10 MED ORDER — DIPHENHYDRAMINE HCL 50 MG/ML IJ SOLN
25.0000 mg | Freq: Once | INTRAMUSCULAR | Status: AC
  Administered 2013-06-10: 25 mg via INTRAVENOUS
  Filled 2013-06-10: qty 1

## 2013-06-10 MED ORDER — DOXYCYCLINE HYCLATE 100 MG PO CAPS: 100.0000 mg | ORAL_CAPSULE | Freq: Two times a day (BID) | ORAL | Status: AC

## 2013-06-10 MED ORDER — OXYCODONE-ACETAMINOPHEN 5-325 MG PO TABS: 1.0000 | ORAL_TABLET | Freq: Three times a day (TID) | ORAL | Status: AC | PRN

## 2013-06-10 MED ORDER — METRONIDAZOLE 500 MG PO TABS
2000.0000 mg | ORAL_TABLET | Freq: Once | ORAL | Status: AC
  Administered 2013-06-10: 2000 mg via ORAL
  Filled 2013-06-10: qty 4

## 2013-06-10 MED ORDER — PERMETHRIN 5 % EX CREA: TOPICAL_CREAM | CUTANEOUS | Status: AC

## 2013-06-10 MED ORDER — SODIUM CHLORIDE 0.9 % IV BOLUS (SEPSIS)
1000.0000 mL | Freq: Once | INTRAVENOUS | Status: AC
  Administered 2013-06-10: 1000 mL via INTRAVENOUS

## 2013-06-10 MED ORDER — MORPHINE SULFATE 4 MG/ML IJ SOLN
4.0000 mg | Freq: Once | INTRAMUSCULAR | Status: AC
  Administered 2013-06-10: 4 mg via INTRAVENOUS
  Filled 2013-06-10: qty 1

## 2013-06-10 NOTE — Discharge Instructions (Signed)
You have been diagnosed with having trichomoniasis, likely from sexual activity.  Please take antibiotic for the full duration.  Your rash may be related to scabies.  Apply permethrin cream as instructed.  Take benadryl over the counter for itch.  Follow up with your doctor for further care.  Return if your symptoms worsen or if you have other concerns.    Trichomoniasis Trichomoniasis is an infection, caused by the Trichomonas organism, that affects both women and men. In women, the outer female genitalia and the vagina are affected. In men, the penis is mainly affected, but the prostate and other reproductive organs can also be involved. Trichomoniasis is a sexually transmitted disease (STD) and is most often passed to another person through sexual contact. The majority of people who get trichomoniasis do so from a sexual encounter and are also at risk for other STDs. CAUSES   Sexual intercourse with an infected partner.  It can be present in swimming pools or hot tubs. SYMPTOMS   Abnormal gray-green frothy vaginal discharge in women.  Vaginal itching and irritation in women.  Itching and irritation of the area outside the vagina in women.  Penile discharge with or without pain in males.  Inflammation of the urethra (urethritis), causing painful urination.  Bleeding after sexual intercourse. RELATED COMPLICATIONS  Pelvic inflammatory disease.  Infection of the uterus (endometritis).  Infertility.  Tubal (ectopic) pregnancy.  It can be associated with other STDs, including gonorrhea and chlamydia, hepatitis B, and HIV. COMPLICATIONS DURING PREGNANCY  Early (premature) delivery.  Premature rupture of the membranes (PROM).  Low birth weight. DIAGNOSIS   Visualization of Trichomonas under the microscope from the vagina discharge.  Ph of the vagina greater than 4.5, tested with a test tape.  Trich Rapid Test.  Culture of the organism, but this is not usually needed.  It  may be found on a Pap test.  Having a "strawberry cervix,"which means the cervix looks very red like a strawberry. TREATMENT   You may be given medication to fight the infection. Inform your caregiver if you could be or are pregnant. Some medications used to treat the infection should not be taken during pregnancy.  Over-the-counter medications or creams to decrease itching or irritation may be recommended.  Your sexual partner will need to be treated if infected. HOME CARE INSTRUCTIONS   Take all medication prescribed by your caregiver.  Take over-the-counter medication for itching or irritation as directed by your caregiver.  Do not have sexual intercourse while you have the infection.  Do not douche or wear tampons.  Discuss your infection with your partner, as your partner may have acquired the infection from you. Or, your partner may have been the person who transmitted the infection to you.  Have your sex partner examined and treated if necessary.  Practice safe, informed, and protected sex.  See your caregiver for other STD testing. SEEK MEDICAL CARE IF:   You still have symptoms after you finish the medication.  You have an oral temperature above 102 F (38.9 C).  You develop belly (abdominal) pain.  You have pain when you urinate.  You have bleeding after sexual intercourse.  You develop a rash.  The medication makes you sick or makes you throw up (vomit). Document Released: 08/28/2000 Document Revised: 05/27/2011 Document Reviewed: 09/23/2008 [facility] Patient Information 2014 [place], [place]. Scabies Scabies are small bugs (mites) that burrow under the skin and cause red bumps and severe itching. These bugs can only be seen with a  microscope. Scabies are highly contagious. They can spread easily from person to person by direct contact. They are also spread through sharing clothing or linens that have the scabies mites living in them. It is not unusual for an  entire family to become infected through shared towels, clothing, or bedding.  HOME CARE INSTRUCTIONS   Your caregiver may prescribe a cream or lotion to kill the mites. If cream is prescribed, massage the cream into the entire body from the neck to the bottom of both feet. Also massage the cream into the scalp and face if your child is less than 26 year old. Avoid the eyes and mouth. Do not wash your hands after application.  Leave the cream on for 8 to 12 hours. Your child should bathe or shower after the 8 to 12 hour application period. Sometimes it is helpful to apply the cream to your child right before bedtime.  One treatment is usually effective and will eliminate approximately 95% of infestations. For severe cases, your caregiver may decide to repeat the treatment in 1 week. Everyone in your household should be treated with one application of the cream.  New rashes or burrows should not appear within 24 to 48 hours after successful treatment. However, the itching and rash may last for 2 to 4 weeks after successful treatment. Your caregiver may prescribe a medicine to help with the itching or to help the rash go away more quickly.  Scabies can live on clothing or linens for up to 3 days. All of your child's recently used clothing, towels, stuffed toys, and bed linens should be washed in hot water and then dried in a dryer for at least 20 minutes on high heat. Items that cannot be washed should be enclosed in a plastic bag for at least 3 days.  To help relieve itching, bathe your child in a cool bath or apply cool washcloths to the affected areas.  Your child may return to school after treatment with the prescribed cream. SEEK MEDICAL CARE IF:   The itching persists longer than 4 weeks after treatment.  The rash spreads or becomes infected. Signs of infection include red blisters or yellow-tan crust. Document Released: 03/04/2005 Document Revised: 05/27/2011 Document Reviewed:  07/13/2008 [facility] Patient Information 2014 [place], [place].

## 2013-06-10 NOTE — ED Notes (Signed)
Pt returned from ultrasound.  Lab called to collect blood.

## 2013-06-10 NOTE — ED Notes (Signed)
She c/o "sharp" lower abd pain since yesterday. She had nausea and vomiting yesterday. She denies bowel/bladder changes

## 2013-06-10 NOTE — ED Provider Notes (Signed)
CSN: [id number]     Arrival date & time 06/10/13  1249 History   First MD Initiated Contact with Patient 06/10/13 1331     Chief Complaint  Patient presents with  . Abdominal Pain     (Consider location/radiation/quality/duration/timing/severity/associated sxs/prior Treatment) HPI  28 year old female with history of anemia, gestational diabetes, UTI, who presents complaining of abdominal pain. Patient reports for the past 3 days she has gradual onset of periumbilical abdominal pain which radiates to her back. Described pain as a crampy sensation, lasting for about 10-20 minutes worsening when she lays on the affected side and minimally improved with taking Tylenol. She also endorsed having a fever as high as 1032 days ago for which she took Tylenol for it. People feel nauseous and has vomited 3 times. Vomitus is nonbloody nonbilious. She also endorsed very itchy rash throughout her body in the web of fingers and toes, and her buttocks, and her low back. She has been scratching at it. She also has history of migraine and states she's been having persistent headache. Headache is similar to her migraine and she would have at least twice weekly. She denies runny nose sneezing coughing, neck stiffness, chest pain, shortness of breath, dysuria, hematuria, vaginal bleeding, vaginal discharge, hematochezia or melena. No other contact with similar rash. No change in soaps, detergent, new pets, new medication, or recent travel. Has normal bowel movement.  Past Medical History  Diagnosis Date  . Anemia   . Potassium (K) deficiency   . Headache(784.0)   . Gestational diabetes     all  . Urinary tract infection   . Depression     doing good  . Pregnancy induced hypertension     all preg   Past Surgical History  Procedure Laterality Date  . Cesarean section    . Tonsillectomy    . Cholecystectomy    . Cesarean section  10/12/2010    Procedure: CESAREAN SECTION;  Surgeon: Lazaro ArmsLuther H Eure, MD;   Location: [facility];  Service: Gynecology;  Laterality: N/A;  Repeat Cesarean Section with Delivery Baby Boy @ 1756; Apgars 7/9  . Cesarean section  10/12/2010    Procedure: CESAREAN SECTION;  Surgeon: Lazaro ArmsLuther H Eure, MD;  Location: [facility];  Service: Gynecology;  Laterality: N/A;  . Cesarean section  02/22/2012    Procedure: CESAREAN SECTION;  Surgeon: Lazaro ArmsLuther H Eure, MD;  Location: [facility];  Service: Obstetrics;  Laterality: N/A;  with bilateral tubal ligation   . Tubal ligation  02/22/2012    Procedure: BILATERAL TUBAL LIGATION;  Surgeon: Lazaro ArmsLuther H Eure, MD;  Location: [facility];  Service: Obstetrics;;   Family History  Problem Relation Age of Onset  . Hypertension Mother   . Diabetes Mother   . Hypertension Father   . Diabetes Father   . Hypertension Maternal Grandmother   . Diabetes Maternal Grandmother   . Early death Sister     Died age 58six ("natural cause")   History  Substance Use Topics  . Smoking status: Former Smoker    Types: Cigarettes  . Smokeless tobacco: Never Used     Comment: 2012  . Alcohol Use: No   OB History   Grav Para Term Preterm Abortions TAB SAB Ect Mult Living   4 4 3 1      4      Review of Systems  All other systems reviewed and are negative.      Allergies  Vicodin  Home Medications   Current Outpatient Rx  Name  Route  Sig  Dispense  Refill  . ferrous sulfate 325 (65 FE) MG EC tablet   Oral   Take 325 mg by mouth daily.         . potassium chloride SA (K-DUR,KLOR-CON) 20 MEQ tablet   Oral   Take 20 mEq by mouth daily.          BP 125/81  Pulse 90  Temp(Src) 98.4 F (36.9 C) (Oral)  Resp 16  Ht 5\' 6"  (1.676 m)  Wt 223 lb (101.152 kg)  BMI 36.01 kg/m2  SpO2 99% Physical Exam  Nursing note and vitals reviewed. Constitutional: She appears well-developed and well-nourished. No distress.  Awake, alert, nontoxic appearance  HENT:  Head: Atraumatic.  Mouth/Throat: Oropharynx is clear and moist.  Eyes: Conjunctivae are normal. Right  eye exhibits no discharge. Left eye exhibits no discharge.  Neck: Neck supple.  No nuchal rigidity  Cardiovascular: Normal rate and regular rhythm.   Pulmonary/Chest: Effort normal. No respiratory distress. She exhibits no tenderness.  Abdominal: Soft. Bowel sounds are normal. There is tenderness (Mild periumbilical abdominal tenderness without guarding or rebound tenderness. No Murphy's sign, no McBurney's point, no peritoneal sign.). There is no rebound and no guarding.  Musculoskeletal: She exhibits no tenderness.  ROM appears intact, no obvious focal weakness  Neurological:  Mental status and motor strength appears intact  Skin: Rash (Small papular rash lesions noted to the fingers, toes, upper back, lower back, left right buttock, bilateral medial thigh, bilateral forearms with multiple scratch marks noted throughout. No obvious signs of infection.) noted.  Psychiatric: She has a normal mood and affect.    ED Course  Procedures (including critical care time)  2:24 PM Patient here with rash to the body suspicious for scabies. She also has periumbilical abdominal pain for the past 3 days however she is afebrile with stable normal vital signs. He has no WBC and reassuring labs. She has no peritoneal sign. At this time I have low suspicion for appendicitis.  Care discussed with Dr. Adriana Simas.   3:02 PM Patient is afebrile with stable normal vital signs. Pregnancy test negative. UA with questionable UTI although patient denies any significant dysuria. Patient does have an elevated alkaline phosphatase of 140, lower than her baseline but given that she has abdominal pain, worsened pain with eating we'll obtain abdominal ultrasound to rule out biliary disease.  4:11 PM abd [place] without acute changes.  Pain has improved.  Has evidence of trichomoniasis, flagyl 2g given.  Rash likely scabies, will prescribe permethrin.  Since pt have questionable UTI and also having signs of sexually transmitted  disease, will also prescribe doxy.  Short course of pain medication prescribed.  Strict return precaution given as sxs may be early signs of appendicitis.  Pt voice understanding and agrees with plan.    Labs Review Labs Reviewed  WET PREP, GENITAL - Abnormal; Notable for the following:    Trich, Wet Prep MANY (*)    Clue Cells Wet Prep HPF POC FEW (*)    WBC, Wet Prep HPF POC FEW (*)    All other components within normal limits  CBC WITH DIFFERENTIAL - Abnormal; Notable for the following:    Eosinophils Relative 8 (*)    All other components within normal limits  COMPREHENSIVE METABOLIC PANEL - Abnormal; Notable for the following:    Glucose, Bld 113 (*)    Albumin 3.4 (*)    Alkaline Phosphatase 140 (*)    Total Bilirubin 0.2 (*)  GFR calc non Af Amer 89 (*)    All other components within normal limits  URINALYSIS, ROUTINE W REFLEX MICROSCOPIC - Abnormal; Notable for the following:    APPearance HAZY (*)    Specific Gravity, Urine >1.030 (*)    Leukocytes, UA TRACE (*)    All other components within normal limits  URINE MICROSCOPIC-ADD ON - Abnormal; Notable for the following:    Squamous Epithelial / LPF MANY (*)    Bacteria, UA MANY (*)    All other components within normal limits  GC/CHLAMYDIA PROBE AMP  HIV ANTIBODY (ROUTINE TESTING)  POC URINE PREG, ED   Imaging Review US Abdomen Complete  06/10/2013   CLINICAL DATA:  Anemia  EXAM: ULTRASOUND ABDOMEN COMPLETE  COMPARISON:  None.  FINDINGS: Gallbladder:  Surgically removed  Common bile duct:  Diameter: 4.5 mm.  Liver:  No focal lesion identified. Within normal limits in parenchymal echogenicity.  IVC:  No abnormality visualized.  Pancreas:  Visualized portion unremarkable.  Spleen:  Size and appearance within normal limits.  Right Kidney:  Length: 11.9 cm. Echogenicity within normal limits. No mass or hydronephrosis visualized.  Left Kidney:  Length: 11 cm. Echogenicity within normal limits. No mass or hydronephrosis  visualized.  Abdominal aorta:  No aneurysm visualized.  Other findings:  None.  IMPRESSION: Status post cholecystectomy.  No acute abnormality is noted.   Electronically Signed   By: Alcide Clever M.D.   On: 06/10/2013 15:40     EKG Interpretation None      MDM   Final diagnoses:  Trichomoniasis  Rash    BP 160/112  Pulse 68  Temp(Src) 98.4 F (36.9 C) (Oral)  Resp 16  Ht 5\' 6"  (1.676 m)  Wt 223 lb (101.152 kg)  BMI 36.01 kg/m2  SpO2 100%  I have reviewed nursing notes and vital signs. I personally reviewed the imaging tests through PACS system  I reviewed available ER/hospitalization records thought the EMR     Fayrene Helper, PA-C 06/10/13 1640

## 2013-06-11 LAB — GC/CHLAMYDIA PROBE AMP
CT PROBE, AMP APTIMA: NEGATIVE
GC Probe RNA: NEGATIVE

## 2013-06-12 NOTE — ED Provider Notes (Signed)
Medical screening examination/treatment/procedure(s) were performed by non-physician practitioner and as supervising physician I was immediately available for consultation/collaboration.   EKG Interpretation None       Donnetta HutchingBrian Srihari Shellhammer, MD 06/12/13 1540

## 2014-01-17 ENCOUNTER — Encounter (HOSPITAL_COMMUNITY): Payer: Self-pay | Admitting: Emergency Medicine

## 2014-03-31 ENCOUNTER — Encounter (HOSPITAL_COMMUNITY): Payer: Self-pay | Admitting: Obstetrics & Gynecology

## 2014-08-25 ENCOUNTER — Encounter (HOSPITAL_COMMUNITY): Payer: Self-pay | Admitting: Obstetrics & Gynecology
